# Patient Record
Sex: Female | Born: 1945
Health system: Southern US, Community
[De-identification: ages and names within clinical notes are randomized; demographics above are authoritative.]

## PROBLEM LIST (undated history)

## (undated) DIAGNOSIS — L719 Rosacea, unspecified: Secondary | ICD-10-CM

## (undated) DIAGNOSIS — Z8619 Personal history of other infectious and parasitic diseases: Secondary | ICD-10-CM

## (undated) DIAGNOSIS — E785 Hyperlipidemia, unspecified: Secondary | ICD-10-CM

## (undated) DIAGNOSIS — L570 Actinic keratosis: Secondary | ICD-10-CM

## (undated) DIAGNOSIS — J301 Allergic rhinitis due to pollen: Secondary | ICD-10-CM

## (undated) HISTORY — DX: Rosacea, unspecified: L71.9

## (undated) HISTORY — PX: ABDOMINAL HYSTERECTOMY: SHX81

## (undated) HISTORY — DX: Allergic rhinitis due to pollen: J30.1

## (undated) HISTORY — DX: Hyperlipidemia, unspecified: E78.5

## (undated) HISTORY — DX: Actinic keratosis: L57.0

## (undated) HISTORY — DX: Personal history of other infectious and parasitic diseases: Z86.19

## (undated) HISTORY — PX: OOPHORECTOMY: SHX86

---

## 1974-07-26 HISTORY — PX: TUBAL LIGATION: SHX77

## 2007-05-27 LAB — CONVERTED CEMR LAB: Pap Smear: NORMAL

## 2007-08-23 ENCOUNTER — Ambulatory Visit: Payer: Self-pay | Admitting: Nurse Practitioner

## 2007-08-23 DIAGNOSIS — E7800 Pure hypercholesterolemia, unspecified: Secondary | ICD-10-CM | POA: Insufficient documentation

## 2007-08-23 DIAGNOSIS — L259 Unspecified contact dermatitis, unspecified cause: Secondary | ICD-10-CM | POA: Insufficient documentation

## 2007-08-23 LAB — CONVERTED CEMR LAB: HDL: 62 mg/dL

## 2007-09-06 ENCOUNTER — Ambulatory Visit: Payer: Self-pay | Admitting: Gastroenterology

## 2007-09-11 ENCOUNTER — Ambulatory Visit: Payer: Self-pay | Admitting: *Deleted

## 2007-09-20 ENCOUNTER — Encounter (INDEPENDENT_AMBULATORY_CARE_PROVIDER_SITE_OTHER): Payer: Self-pay | Admitting: Nurse Practitioner

## 2007-09-20 ENCOUNTER — Ambulatory Visit: Payer: Self-pay | Admitting: Gastroenterology

## 2007-09-20 HISTORY — PX: COLONOSCOPY: SHX174

## 2007-10-04 ENCOUNTER — Ambulatory Visit: Payer: Self-pay | Admitting: Nurse Practitioner

## 2007-10-05 LAB — CONVERTED CEMR LAB
Albumin: 4.6 g/dL (ref 3.5–5.2)
Alkaline Phosphatase: 85 units/L (ref 39–117)
BUN: 14 mg/dL (ref 6–23)
CO2: 28 meq/L (ref 19–32)
Calcium: 9.7 mg/dL (ref 8.4–10.5)
Chloride: 107 meq/L (ref 96–112)
Cholesterol: 209 mg/dL — ABNORMAL HIGH (ref 0–200)
Eosinophils Absolute: 0.2 10*3/uL (ref 0.0–0.7)
Glucose, Bld: 88 mg/dL (ref 70–99)
HDL: 54 mg/dL (ref >39)
Lymphocytes Relative: 45 % (ref 12–46)
Lymphs Abs: 2.8 10*3/uL (ref 0.7–4.0)
MCV: 97.9 fL (ref 78.0–100.0)
Monocytes Relative: 9 % (ref 3–12)
Neutrophils Relative %: 42 % — ABNORMAL LOW (ref 43–77)
Potassium: 5 meq/L (ref 3.5–5.3)
RBC: 4.25 M/uL (ref 3.87–5.11)
Total Protein: 7.1 g/dL (ref 6.0–8.3)
Triglycerides: 187 mg/dL — ABNORMAL HIGH (ref <150)
WBC: 6.2 10*3/uL (ref 4.0–10.5)

## 2010-03-26 ENCOUNTER — Ambulatory Visit: Payer: Self-pay | Admitting: Family Medicine

## 2010-03-26 DIAGNOSIS — J309 Allergic rhinitis, unspecified: Secondary | ICD-10-CM | POA: Insufficient documentation

## 2010-03-26 DIAGNOSIS — N812 Incomplete uterovaginal prolapse: Secondary | ICD-10-CM | POA: Insufficient documentation

## 2010-04-29 ENCOUNTER — Ambulatory Visit: Payer: Self-pay

## 2010-04-29 ENCOUNTER — Encounter: Payer: Self-pay | Admitting: Family Medicine

## 2010-05-07 ENCOUNTER — Ambulatory Visit: Payer: Self-pay | Admitting: Family Medicine

## 2010-05-27 ENCOUNTER — Encounter: Payer: Self-pay | Admitting: Family Medicine

## 2010-06-11 ENCOUNTER — Ambulatory Visit: Payer: Self-pay | Admitting: Obstetrics & Gynecology

## 2010-08-27 NOTE — Letter (Signed)
Summary: Results Follow up Letter  [place] [facility]  [address] [place], [place] [address]   Phone: [phone]  Fax: [phone]    05/27/2010 MRN: [id number]    KEYANNI WHITTINGHILL [address] [place], [place]  [address]    Dear Ms. Metts,  The following are the results of your recent test(s):  Test         Result    Pap Smear:        Normal _____  Not Normal _____ Comments: ______________________________________________________ Cholesterol: LDL(Bad cholesterol):         Your goal is less than:         HDL (Good cholesterol):       Your goal is more than: Comments:  ______________________________________________________ Mammogram:        Normal _X____  Not Normal _____ Comments:Repeat in one year.   ___________________________________________________________________ Hemoccult:        Normal _____  Not normal _______ Comments:    _____________________________________________________________________ Other Tests:    We routinely do not discuss normal results over the telephone.  If you desire a copy of the results, or you have any questions about this information we can discuss them at your next office visit.   Sincerely,    Idamae Schuller Serigne Kubicek,MD  MT/ri

## 2010-08-27 NOTE — Letter (Signed)
Summary: Patient Questionnaire  Patient Questionnaire   Imported By: Beau Fanny 03/26/2010 14:00:49  _____________________________________________________________________  External Attachment:    Type:   Image     Comment:   External Document

## 2010-08-27 NOTE — Assessment & Plan Note (Signed)
Summary: NEW PT TO [facility]   Vital Signs:  Patient profile:   65 year old female Height:      63.75 inches Weight:      140.25 pounds BMI:     24.35 Temp:     98.8 degrees F oral Pulse rate:   80 / minute Pulse rhythm:   regular BP sitting:   136 / 84  (left arm) Cuff size:   regular  Vitals Entered By: Lewanda Rife LPN (March 26, 2010 11:19 AM) CC: New pt to establish   History of Present Illness: has not had regular physician in a while  [facility]   mother panc ca father lung ca  sister breast cancer   due to strong fam hx - did have a colonoscopy  2/09- is normal  mam was then too   has another year for medicare to kick in  does not qualify for medicaid   she does not work  hard time finding work -- keeps looking   never smoked  occ drinks alcohol -- wine , more beer this summer never had a drinking problem   hx of high cholesterol -- was 270s when last checked  stays very active  changed her diet over a year ago -- stays away from fatty foods  oatmeal/ bran for bkfast/ lots of veg and beans/ not much meat  on pravachol in the past - none for 2 years   bladder or uterine issues? -- has prolapse and this causes discomfort unable to have intercourse  got worse this summer - and one episode of bleeding -- very slight  no gyn exam in 2 years  occ has urgency to urinate without incontinence      Preventive Screening-Counseling & Management  Alcohol-Tobacco     Smoking Status: never  Caffeine-Diet-Exercise     Does Patient Exercise: no  Allergies (verified): No Known Drug Allergies  Past History:  Past Surgical History: Last updated: 08/23/2007 Tubal ligation 1976  Family History: Last updated: 03/26/2010 Family History Breast cancer 1st degree relative <50 - sister pancreatic cancer - mother deceased lung cancer - father (deceased) Family History Diabetes 1st degree relative - mother Maternal grandfather stroke,  deceased  Social History: Last updated: 03/26/2010 never smoked  Alcohol use-yes (socially) Drug use-no 4 children Married- 48 years  husband on disability after a car accident  Retired Regular exercise-no Never Smoked  Risk Factors: Caffeine Use: 3 (08/23/2007) Exercise: no (03/26/2010)  Risk Factors: Smoking Status: never (03/26/2010)  Past Medical History: Hx of chicken pox Hay fever High cholesterol  Family History: Family History Breast cancer 1st degree relative <50 - sister pancreatic cancer - mother deceased lung cancer - father (deceased) Family History Diabetes 1st degree relative - mother Maternal grandfather stroke, deceased  Social History: never smoked  Alcohol use-yes (socially) Drug use-no 4 children Married- 48 years  husband on disability after a car accident  Retired Regular exercise-no Never Smoked Does Patient Exercise:  no Smoking Status:  never  Review of Systems General:  Denies fatigue, loss of appetite, and malaise. Eyes:  Denies blurring and eye irritation. CV:  Denies chest pain or discomfort and palpitations. Resp:  Denies cough, shortness of breath, and wheezing. GI:  Denies abdominal pain, change in bowel habits, indigestion, and nausea. GU:  Complains of incontinence; denies dysuria, urinary frequency, and urinary hesitancy; occ/ seldom incontinence. MS:  Denies muscle aches, cramps, and muscle weakness. Derm:  Denies itching,  lesion(s), poor wound healing, and rash. Neuro:  Denies numbness and tingling. Heme:  Denies abnormal bruising and bleeding.  Physical Exam  General:  Well-developed,well-nourished,in no acute distress; alert,appropriate and cooperative throughout examination Head:  normocephalic, atraumatic, and no abnormalities observed.   Eyes:  vision grossly intact, pupils equal, pupils round, and pupils reactive to light.   Mouth:  pharynx pink and moist.   Neck:  supple with full rom and no masses or  thyromegally, no JVD or carotid bruit  Chest Wall:  No deformities, masses, or tenderness noted. Lungs:  Normal respiratory effort, chest expands symmetrically. Lungs are clear to auscultation, no crackles or wheezes. Heart:  Normal rate and regular rhythm. S1 and S2 normal without gallop, murmur, click, rub or other extra sounds. Abdomen:  Bowel sounds positive,abdomen soft and non-tender without masses, organomegaly or hernias noted. no renal bruits no suprapubic tenderness or fullness felt  Genitalia:  notable cystocele and mild rectocele mild uterine prolapse no M or tenderness on bimanual exam normal introitus, no external lesions, no vaginal discharge, and mucosa pink and moist.   Msk:  No deformity or scoliosis noted of thoracic or lumbar spine.  no acute joint changes  Pulses:  R and L carotid,radial,femoral,dorsalis pedis and posterior tibial pulses are full and equal bilaterally Extremities:  No clubbing, cyanosis, edema, or deformity noted with normal full range of motion of all joints.   Neurologic:  sensation intact to light touch, gait normal, and DTRs symmetrical and normal.   Skin:  Intact without suspicious lesions or rashes Cervical Nodes:  No lymphadenopathy noted Inguinal Nodes:  No significant adenopathy Psych:  normal affect, talkative and pleasant    Impression & Recommendations:  Problem # 1:  CYSTOCELE WITH INCOMPLETE UTERINE PROLAPSE (ICD-618.2) Assessment New will ref to gyn for further eval  ? if pessary would be an opiton Orders: Gynecologic Referral (Gyn)  Problem # 2:  OTHER SCREENING MAMMOGRAM (ICD-V76.12) Assessment: Comment Only schedule mammogram  pt does have family hx  Orders: Radiology Referral (Radiology)  Problem # 3:  HYPERCHOLESTEROLEMIA (ICD-272.0) Assessment: Unchanged rev low sat fat diet  pt cannot afford meds or testing at this time The following medications were removed from the medication list:    Pravachol 40 Mg Tabs  (Pravastatin sodium) .Marland Kitchen... 1 tablet by mouth nightly for cholesterol  Problem # 4:  Preventive Health Care (ICD-V70.0) Assessment: Comment Only pt may be interested in addressing more of these issues when she gets insurance  Complete Medication List: 1)  Hydrocortisone 2.5 % Ext Crea (Hydrocortisone) .Marland Kitchen.. 1 application to affected area two times a day as needed 2)  Magnesium ?mg  .... Take 1 tablet by mouth once a day 3)  Flax Seed Oil 1000 Mg Caps (Flaxseed (linseed)) .... Two capsules by mouth daily 4)  Tylenol Arthritis Pain 650 Mg Cr-tabs (Acetaminophen) .... Otc as directed.  Patient Instructions: 1)  we will do referral to gyn at check out  2)  we will do referral for mammogram at check out  3)  keep up the healthy diet   Current Allergies (reviewed today): No known allergies

## 2010-09-11 ENCOUNTER — Ambulatory Visit: Payer: Self-pay | Admitting: Obstetrics & Gynecology

## 2010-09-18 ENCOUNTER — Ambulatory Visit: Payer: Self-pay | Admitting: Advanced Practice Midwife

## 2010-10-07 ENCOUNTER — Ambulatory Visit: Payer: Self-pay | Admitting: Obstetrics and Gynecology

## 2011-04-01 ENCOUNTER — Encounter: Payer: Self-pay | Admitting: Family Medicine

## 2011-04-02 ENCOUNTER — Ambulatory Visit (INDEPENDENT_AMBULATORY_CARE_PROVIDER_SITE_OTHER): Payer: Self-pay | Admitting: Family Medicine

## 2011-04-02 ENCOUNTER — Encounter: Payer: Self-pay | Admitting: Family Medicine

## 2011-04-02 DIAGNOSIS — R5383 Other fatigue: Secondary | ICD-10-CM

## 2011-04-02 DIAGNOSIS — M791 Myalgia, unspecified site: Secondary | ICD-10-CM

## 2011-04-02 DIAGNOSIS — R51 Headache: Secondary | ICD-10-CM

## 2011-04-02 DIAGNOSIS — R519 Headache, unspecified: Secondary | ICD-10-CM | POA: Insufficient documentation

## 2011-04-02 DIAGNOSIS — IMO0001 Reserved for inherently not codable concepts without codable children: Secondary | ICD-10-CM

## 2011-04-02 DIAGNOSIS — R5381 Other malaise: Secondary | ICD-10-CM

## 2011-04-02 LAB — VITAMIN B12: Vitamin B-12: 516 pg/mL (ref 211–911)

## 2011-04-02 LAB — COMPREHENSIVE METABOLIC PANEL
ALT: 28 U/L (ref 0–35)
AST: 28 U/L (ref 0–37)
Albumin: 4 g/dL (ref 3.5–5.2)
CO2: 25 mEq/L (ref 19–32)
Calcium: 9 mg/dL (ref 8.4–10.5)
Chloride: 98 mEq/L (ref 96–112)
Creatinine, Ser: 0.8 mg/dL (ref 0.4–1.2)
GFR: 75.48 mL/min (ref >60.00)
Potassium: 4.8 mEq/L (ref 3.5–5.1)
Total Protein: 7.5 g/dL (ref 6.0–8.3)

## 2011-04-02 LAB — CBC WITH DIFFERENTIAL/PLATELET
Basophils Absolute: 0 10*3/uL (ref 0.0–0.1)
Eosinophils Absolute: 0 10*3/uL (ref 0.0–0.7)
Lymphocytes Relative: 48.7 % — ABNORMAL HIGH (ref 12.0–46.0)
MCHC: 33.8 g/dL (ref 30.0–36.0)
MCV: 94.1 fl (ref 78.0–100.0)
Monocytes Absolute: 0.7 10*3/uL (ref 0.1–1.0)
Neutrophils Relative %: 40.1 % — ABNORMAL LOW (ref 43.0–77.0)
Platelets: 199 10*3/uL (ref 150.0–400.0)
RBC: 4.37 Mil/uL (ref 3.87–5.11)
RDW: 12.4 % (ref 11.5–14.6)

## 2011-04-02 LAB — HIGH SENSITIVITY CRP: CRP, High Sensitivity: 25.68 mg/L — ABNORMAL HIGH (ref 0.000–5.000)

## 2011-04-02 NOTE — Progress Notes (Signed)
Subjective:    Patient ID: Chloe Ali, female    DOB: [birth year], 65 y.o.   MRN: [id number]  HPI CC: feeling ill  4 wk h/o bad headaches, progressing to muscle aches worse behind knees, and body aches, neck pain.  Legs feel like going to give out when standing for long period of time.  Also noticing increase in sweating and fatigue.  Felt like flu initially.  HA usually controlled with tylenol and excedrin.  Occasional indigestion.  HA described as frontal and spreads to back of head.  Not localized to temple region.  No vision changes.  Seemed to get better, then headaches and joint pain restarted 2 wks ago.    No h/o migraines, HA in past.  3 mo ago gave blood and told blood count normal.  No fevers/chills, recent tick bite or exposure, rashes.  No abd pain, n/v/d.  No vision changes, no hip or thigh problems.  Mild morning stiffness.  No sick contacts.  No recent travel outside of city.  Nonsmoker, social EtOH.  Medications and allergies reviewed and updated in chart.  Past histories reviewed and updated if relevant as below. Patient Active Problem List  Diagnoses  . HYPERCHOLESTEROLEMIA  . ALLERGIC RHINITIS CAUSE UNSPECIFIED  . CYSTOCELE WITH INCOMPLETE UTERINE PROLAPSE  . CONTACT DERMATITIS  . Headache  . Fatigue   Past Medical History  Diagnosis Date  . History of chicken pox   . Hay fever   . HLD (hyperlipidemia)    Past Surgical History  Procedure Date  . Tubal ligation 1976   History  Substance Use Topics  . Smoking status: Never Smoker   . Smokeless tobacco: Not on file  . Alcohol Use: Yes     Social   Family History  Problem Relation Age of Onset  . Breast cancer Sister   . Pancreatic cancer Mother   . Lung cancer Father   . Diabetes Mother   . Stroke Maternal Grandfather    No Known Allergies Current Outpatient Prescriptions on File Prior to Visit  Medication Sig Dispense Refill  . acetaminophen (TYLENOL) 650 MG CR tablet Take 650 mg by mouth  every 8 (eight) hours as needed.        . Flaxseed, Linseed, (FLAX SEED OIL) 1000 MG CAPS Take 2 capsules by mouth daily.         Review of Systems Per HPI    Objective:   Physical Exam  Nursing note and vitals reviewed. Constitutional: She appears well-developed and well-nourished. No distress.  HENT:  Head: Normocephalic and atraumatic.  Right Ear: Hearing, tympanic membrane, external ear and ear canal normal.  Left Ear: Hearing, tympanic membrane, external ear and ear canal normal.  Nose: Nose normal. No mucosal edema or rhinorrhea. Right sinus exhibits no maxillary sinus tenderness and no frontal sinus tenderness. Left sinus exhibits no maxillary sinus tenderness and no frontal sinus tenderness.  Mouth/Throat: Uvula is midline, oropharynx is clear and moist and mucous membranes are normal. No oropharyngeal exudate, posterior oropharyngeal edema, posterior oropharyngeal erythema or tonsillar abscesses.       No temporal bruits.  Mild tenderness to palpation temporal region.  Eyes: Pupils are equal, round, and reactive to light. No scleral icterus.  Neck: Normal range of motion and full passive range of motion without pain. Neck supple. Carotid bruit is not present.       No neck rigidity  Cardiovascular: Normal rate, regular rhythm, normal heart sounds and intact distal pulses.   No murmur  heard. Pulmonary/Chest: Effort normal and breath sounds normal. No respiratory distress. She has no wheezes. She has no rales.  Abdominal: Soft. Bowel sounds are normal. She exhibits no distension. There is no tenderness. There is no rebound and no guarding.  Musculoskeletal: Normal range of motion. She exhibits no edema.       Mild pain no stiffness at shoulders, no pain at hips. Mild tenderness posterior to knees  Lymphadenopathy:    She has no cervical adenopathy.  Skin: Skin is warm. No rash noted.       Mild papular rash upper chest, per pt longstanding photosensitivity  Psychiatric: She has  a normal mood and affect.          Assessment & Plan:

## 2011-04-02 NOTE — Patient Instructions (Signed)
Blood work today. Overall things are looking ok - we will call you with results. I'm not sure what's causing you to feel so bad, I don't think it's any infection going on.

## 2011-04-03 ENCOUNTER — Telehealth: Payer: Self-pay | Admitting: Family Medicine

## 2011-04-03 DIAGNOSIS — R51 Headache: Secondary | ICD-10-CM

## 2011-04-03 DIAGNOSIS — R5383 Other fatigue: Secondary | ICD-10-CM

## 2011-04-03 NOTE — Assessment & Plan Note (Addendum)
With myalgia/arthralgias and minimal morning stiffness.  ?rheum condition, check ESR to eval PMR. Unclear etiology of new headaches. Easily controlled with tylenol/excedrin. Monitor for now.  Nontoxic on exam today, no meningeal signs.  Has been going on for 4 wks.  Ok to work up outpatient.

## 2011-04-03 NOTE — Assessment & Plan Note (Signed)
No fevers.  No tick or mosquito exposure that pt knows of. Check blood work to further eval. ?ILI vs viral.

## 2011-04-03 NOTE — Telephone Encounter (Signed)
please notify blood work returned showing low sodium, recommend push fluid like gatorade, not water for now. vitamin B12, kidneys and liver normal, thyroid normal, blood count normal. One measure of inflammation in body returned normal, another returned elevated. May just be viral infection, if that is the case should feel better over time.   If not, I would like her to return this week for more blood work (placed in chart). If any worsening, or new symptoms like fever or vision changes, needs to return here, if significant worsening, needs to seek urgent care.

## 2011-04-05 NOTE — Telephone Encounter (Signed)
Patient notified. She said she has been drinking quite a lot of water, so she will drink gatorade for the next few days and see how she feels. She will come back here if not feeling better or if has fever or vision changes over the next few days and she will go to [facility] or ER if she gets significantly worse.

## 2011-05-06 ENCOUNTER — Encounter (HOSPITAL_COMMUNITY): Payer: Self-pay | Admitting: Radiology

## 2011-05-06 ENCOUNTER — Emergency Department (HOSPITAL_COMMUNITY)
Admission: EM | Admit: 2011-05-06 | Discharge: 2011-05-06 | Disposition: A | Discharge status: Home / Self Care | Payer: Self-pay | Attending: Emergency Medicine | Admitting: Emergency Medicine

## 2011-05-06 ENCOUNTER — Emergency Department (HOSPITAL_COMMUNITY): Payer: Self-pay

## 2011-05-06 DIAGNOSIS — R413 Other amnesia: Secondary | ICD-10-CM | POA: Insufficient documentation

## 2011-05-06 DIAGNOSIS — H538 Other visual disturbances: Secondary | ICD-10-CM | POA: Insufficient documentation

## 2011-05-06 DIAGNOSIS — H53149 Visual discomfort, unspecified: Secondary | ICD-10-CM | POA: Insufficient documentation

## 2011-05-06 DIAGNOSIS — R42 Dizziness and giddiness: Secondary | ICD-10-CM | POA: Insufficient documentation

## 2011-05-06 DIAGNOSIS — F29 Unspecified psychosis not due to a substance or known physiological condition: Secondary | ICD-10-CM | POA: Insufficient documentation

## 2011-05-06 DIAGNOSIS — R51 Headache: Secondary | ICD-10-CM | POA: Insufficient documentation

## 2011-05-06 LAB — URINALYSIS, ROUTINE W REFLEX MICROSCOPIC
Bilirubin Urine: NEGATIVE
Hgb urine dipstick: NEGATIVE
Ketones, ur: NEGATIVE mg/dL
Specific Gravity, Urine: 1.006 (ref 1.005–1.030)
Urobilinogen, UA: 0.2 mg/dL (ref 0.0–1.0)
pH: 6.5 (ref 5.0–8.0)

## 2011-05-06 LAB — COMPREHENSIVE METABOLIC PANEL WITH GFR
ALT: 9 U/L (ref 0–35)
AST: 12 U/L (ref 0–37)
Albumin: 3.3 g/dL — ABNORMAL LOW (ref 3.5–5.2)
Alkaline Phosphatase: 115 U/L (ref 39–117)
BUN: 9 mg/dL (ref 6–23)
CO2: 28 meq/L (ref 19–32)
Calcium: 9.8 mg/dL (ref 8.4–10.5)
Chloride: 99 meq/L (ref 96–112)
Creatinine, Ser: 0.69 mg/dL (ref 0.50–1.10)
GFR calc Af Amer: >90 mL/min
GFR calc non Af Amer: >90 mL/min
Glucose, Bld: 87 mg/dL (ref 70–99)
Potassium: 4.4 meq/L (ref 3.5–5.1)
Sodium: 136 meq/L (ref 135–145)
Total Bilirubin: 0.3 mg/dL (ref 0.3–1.2)
Total Protein: 7.5 g/dL (ref 6.0–8.3)

## 2011-05-06 LAB — DIFFERENTIAL
Basophils Absolute: 0 10*3/uL (ref 0.0–0.1)
Basophils Relative: 0 % (ref 0–1)
Lymphocytes Relative: 28 % (ref 12–46)
Neutro Abs: 6.9 10*3/uL (ref 1.7–7.7)
Neutrophils Relative %: 63 % (ref 43–77)

## 2011-05-06 LAB — CBC
HCT: 35 % — ABNORMAL LOW (ref 36.0–46.0)
Hemoglobin: 12.2 g/dL (ref 12.0–15.0)
RBC: 3.87 MIL/uL (ref 3.87–5.11)
WBC: 10.9 10*3/uL — ABNORMAL HIGH (ref 4.0–10.5)

## 2011-05-06 LAB — TROPONIN I: Troponin I: <0.3 ng/mL

## 2011-05-06 LAB — CK TOTAL AND CKMB (NOT AT ARMC)
CK, MB: 1.6 ng/mL (ref 0.3–4.0)
Relative Index: INVALID (ref 0.0–2.5)
Total CK: 57 U/L (ref 7–177)

## 2011-05-06 LAB — URINE MICROSCOPIC-ADD ON

## 2011-07-29 ENCOUNTER — Ambulatory Visit (INDEPENDENT_AMBULATORY_CARE_PROVIDER_SITE_OTHER): Payer: Self-pay | Admitting: Family Medicine

## 2011-07-29 DIAGNOSIS — Z23 Encounter for immunization: Secondary | ICD-10-CM

## 2011-07-29 NOTE — Progress Notes (Signed)
Flu shot today 

## 2011-12-20 ENCOUNTER — Telehealth: Payer: Self-pay | Admitting: Family Medicine

## 2011-12-20 DIAGNOSIS — E7800 Pure hypercholesterolemia, unspecified: Secondary | ICD-10-CM

## 2011-12-20 DIAGNOSIS — R5383 Other fatigue: Secondary | ICD-10-CM

## 2011-12-20 NOTE — Telephone Encounter (Signed)
Message copied by Judy Pimple on Mon Dec 20, 2011  9:51 PM ------      Message from: Alvina Chou      Created: Wed Dec 15, 2011 10:54 AM      Regarding: labs for Tues 5-28       Patient is scheduled for CPX labs, please order future labs, Thanks , Camelia Eng                  Do you want the other orders that Dr Reece Agar ordered several months ago?

## 2011-12-21 ENCOUNTER — Other Ambulatory Visit (INDEPENDENT_AMBULATORY_CARE_PROVIDER_SITE_OTHER): Payer: Medicare Other

## 2011-12-21 DIAGNOSIS — E7800 Pure hypercholesterolemia, unspecified: Secondary | ICD-10-CM

## 2011-12-21 DIAGNOSIS — R5383 Other fatigue: Secondary | ICD-10-CM

## 2011-12-21 DIAGNOSIS — R5381 Other malaise: Secondary | ICD-10-CM

## 2011-12-21 LAB — COMPREHENSIVE METABOLIC PANEL
ALT: 15 U/L (ref 0–35)
AST: 18 U/L (ref 0–37)
Alkaline Phosphatase: 83 U/L (ref 39–117)
Creatinine, Ser: 0.8 mg/dL (ref 0.4–1.2)
Total Bilirubin: 0.7 mg/dL (ref 0.3–1.2)

## 2011-12-21 LAB — LIPID PANEL
HDL: 59.1 mg/dL (ref >39.00)
Triglycerides: 168 mg/dL — ABNORMAL HIGH (ref 0.0–149.0)
VLDL: 33.6 mg/dL (ref 0.0–40.0)

## 2011-12-21 LAB — CBC WITH DIFFERENTIAL/PLATELET
Basophils Absolute: 0 10*3/uL (ref 0.0–0.1)
Eosinophils Absolute: 0.2 10*3/uL (ref 0.0–0.7)
HCT: 42.4 % (ref 36.0–46.0)
Hemoglobin: 14.4 g/dL (ref 12.0–15.0)
Lymphocytes Relative: 41 % (ref 12.0–46.0)
Lymphs Abs: 2.7 10*3/uL (ref 0.7–4.0)
MCHC: 33.8 g/dL (ref 30.0–36.0)
MCV: 94.7 fl (ref 78.0–100.0)
Monocytes Absolute: 0.4 10*3/uL (ref 0.1–1.0)
Neutro Abs: 3.2 10*3/uL (ref 1.4–7.7)
RDW: 12.9 % (ref 11.5–14.6)

## 2011-12-21 LAB — TSH: TSH: 2.2 u[IU]/mL (ref 0.35–5.50)

## 2011-12-21 LAB — LDL CHOLESTEROL, DIRECT: Direct LDL: 140.7 mg/dL

## 2011-12-28 ENCOUNTER — Encounter: Payer: Self-pay | Admitting: Family Medicine

## 2011-12-28 ENCOUNTER — Ambulatory Visit (INDEPENDENT_AMBULATORY_CARE_PROVIDER_SITE_OTHER): Payer: Medicare Other | Admitting: Family Medicine

## 2011-12-28 VITALS — BP 118/82 | HR 56 | Temp 97.5°F | Ht 63.5 in | Wt 132.8 lb

## 2011-12-28 DIAGNOSIS — L309 Dermatitis, unspecified: Secondary | ICD-10-CM

## 2011-12-28 DIAGNOSIS — Z1231 Encounter for screening mammogram for malignant neoplasm of breast: Secondary | ICD-10-CM

## 2011-12-28 DIAGNOSIS — E7800 Pure hypercholesterolemia, unspecified: Secondary | ICD-10-CM

## 2011-12-28 DIAGNOSIS — L259 Unspecified contact dermatitis, unspecified cause: Secondary | ICD-10-CM

## 2011-12-28 DIAGNOSIS — Z23 Encounter for immunization: Secondary | ICD-10-CM

## 2011-12-28 DIAGNOSIS — L989 Disorder of the skin and subcutaneous tissue, unspecified: Secondary | ICD-10-CM

## 2011-12-28 DIAGNOSIS — J309 Allergic rhinitis, unspecified: Secondary | ICD-10-CM

## 2011-12-28 MED ORDER — MOMETASONE FUROATE 0.1 % EX CREA: TOPICAL_CREAM | Freq: Every day | CUTANEOUS | Status: AC

## 2011-12-28 NOTE — Progress Notes (Signed)
Subjective:    Patient ID: Chloe Ali, female    DOB: Sep 21, 1945, 66 y.o.   MRN: [id number]  HPI Here for check up of chronic medical conditions and to review health mt list   Is feeling good overall  Allergies are worse this year  She breaks out when she gets hot-especially on her upper arms  scaley rash - is seasonal  ? Eczema  Uses cortisone cream otc  Has tried aaveno soap with oatmeal  Uses liquid soaps  Moisturizer - jergens  Also lesion on nose- worried it may be a skin cancer - is dry but not itchy    Chemistry      Component Value Date/Time   NA 142 12/21/2011 0937   K 4.4 12/21/2011 0937   CL 106 12/21/2011 0937   CO2 28 12/21/2011 0937   BUN 12 12/21/2011 0937   CREATININE 0.8 12/21/2011 0937      Component Value Date/Time   CALCIUM 9.6 12/21/2011 0937   ALKPHOS 83 12/21/2011 0937   AST 18 12/21/2011 0937   ALT 15 12/21/2011 0937   BILITOT 0.7 12/21/2011 0937      Lab Results  Component Value Date   WBC 6.5 12/21/2011   HGB 14.4 12/21/2011   HCT 42.4 12/21/2011   MCV 94.7 12/21/2011   PLT 257.0 12/21/2011    Lab Results  Component Value Date   TSH 2.20 12/21/2011        Wt is down 5 lb with bmi of 23- has kept it down from last summer  Diet- eats healthy , eats whole grain cereals and oatmeal , occ eggs/ fruit and veg fresh  Exercise- stays very active , was walking / now is working in the yard   Lipids Lab Results  Component Value Date   CHOL 217* 12/21/2011   CHOL 209* 10/04/2007   CHOL 269 08/23/2007   Lab Results  Component Value Date   HDL 59.10 12/21/2011   HDL 54 10/04/2007   HDL 62 11/06/2438   Lab Results  Component Value Date   LDLCALC 118* 10/04/2007   Lab Results  Component Value Date   TRIG 168.0* 12/21/2011   TRIG 187* 10/04/2007   TRIG 187 08/23/2007   Lab Results  Component Value Date   CHOLHDL 4 12/21/2011   CHOLHDL 3.9 Ratio 10/04/2007   Lab Results  Component Value Date   LDLDIRECT 140.7 12/21/2011   LDLDIRECT 170 08/23/2007     eats eggs  Not a lot of fried foods  No red meat  occ sausage -not often   imms Td--not in past 10 years  zost- has not called insurance co about that - may be interested  Pneumovax  -never had   mammo 10/11 Needs to get that sched Self exam-no lumps or changes   Gyn care Pap nl in 2011  No hx of cervical abn No new partners    colonosc 2/09- thinks she has 10 year recal No bowel changes   Patient Active Problem List  Diagnoses  . HYPERCHOLESTEROLEMIA  . ALLERGIC RHINITIS CAUSE UNSPECIFIED  . CYSTOCELE WITH INCOMPLETE UTERINE PROLAPSE  . CONTACT DERMATITIS  . Headache  . Fatigue  . Eczema  . Other screening mammogram  . Skin lesion   Past Medical History  Diagnosis Date  . History of chicken pox   . Hay fever   . HLD (hyperlipidemia)    Past Surgical History  Procedure Date  . Tubal ligation 1976   History  Substance Use Topics  . Smoking status: Never Smoker   . Smokeless tobacco: Not on file  . Alcohol Use: Yes     Social   Family History  Problem Relation Age of Onset  . Breast cancer Sister   . Pancreatic cancer Mother   . Lung cancer Father   . Diabetes Mother   . Stroke Maternal Grandfather    No Known Allergies Current Outpatient Prescriptions on File Prior to Visit  Medication Sig Dispense Refill  . acetaminophen (TYLENOL) 650 MG CR tablet Take 650 mg by mouth every 8 (eight) hours as needed.        Marland Kitchen aspirin-acetaminophen-caffeine (EXCEDRIN MIGRAINE) 250-250-65 MG per tablet Take 1 tablet by mouth every 6 (six) hours as needed.        . Cholecalciferol (VITAMIN D) 2000 UNITS CAPS Take 1 capsule by mouth daily.        . Flaxseed, Linseed, (FLAX SEED OIL) 1000 MG CAPS Take 2 capsules by mouth daily.        . Linoleic Acid-Sunflower Oil [phone] MG CAPS Take 2 capsules by mouth 2 (two) times daily.        . Magnesium 250 MG TABS Take 1 tablet by mouth daily.              Review of Systems Review of Systems  Constitutional: Negative  for fever, appetite change, fatigue and unexpected weight change.  Eyes: Negative for pain and visual disturbance.  ENT pos for runny nose from allergies  Respiratory: Negative for cough and shortness of breath.   Cardiovascular: Negative for cp or palpitations    Gastrointestinal: Negative for nausea, diarrhea and constipation.  Genitourinary: Negative for urgency and frequency.  Skin: Negative for pallor and pos for rash on arms- dry and itchy    Neurological: Negative for weakness, light-headedness, numbness and headaches.  Hematological: Negative for adenopathy. Does not bruise/bleed easily.  Psychiatric/Behavioral: Negative for dysphoric mood. The patient is not nervous/anxious.         Objective:   Physical Exam  Constitutional: She appears well-developed and well-nourished. No distress.  HENT:  Head: Normocephalic and atraumatic.  Right Ear: External ear normal.  Left Ear: External ear normal.  Nose: Nose normal.  Mouth/Throat: Oropharynx is clear and moist.  Eyes: Conjunctivae and EOM are normal. Pupils are equal, round, and reactive to light. Right eye exhibits no discharge. Left eye exhibits no discharge.  Neck: Normal range of motion. Neck supple. No JVD present. Carotid bruit is not present. No thyromegaly present.  Cardiovascular: Normal rate, regular rhythm, normal heart sounds and intact distal pulses.  Exam reveals no gallop.   Pulmonary/Chest: Breath sounds normal. No respiratory distress. She has no wheezes.  Abdominal: Soft. Bowel sounds are normal. She exhibits no distension, no abdominal bruit and no mass. There is no tenderness.  Genitourinary: No breast swelling, tenderness, discharge or bleeding.       Breast exam: No mass, nodules, thickening, tenderness, bulging, retraction, inflamation, nipple discharge or skin changes noted.  No axillary or clavicular LA.  Chaperoned exam.    Musculoskeletal: Normal range of motion. She exhibits no edema and no tenderness.    Lymphadenopathy:    She has no cervical adenopathy.  Neurological: She is alert. She has normal reflexes. No cranial nerve deficit. She exhibits normal muscle tone. Coordination normal.  Skin: Skin is warm and dry. Rash noted. No erythema. No pallor.       Dry patches with scale on upper arms  consistent with eczema No vesicles or weeping or signs of infection  Dry scaley lesion on nose- slt raised   Psychiatric: She has a normal mood and affect.          Assessment & Plan:

## 2011-12-28 NOTE — Patient Instructions (Addendum)
Use skin products and soaps with no color or fragrance Try elocon cream for your eczema  We will refer you to derm and for mammogram at check out  Td and pneumovax today If you are interested in a shingles/zoster vaccine - call your insurance to check on coverage,( you should not get it within 1 month of other vaccines) , then call us for a prescription  for it to take to a pharmacy that gives the shot

## 2011-12-29 NOTE — Assessment & Plan Note (Signed)
Scheduled annual screening mammogram Nl breast exam today  Encouraged monthly self exams   

## 2011-12-29 NOTE — Assessment & Plan Note (Signed)
Cholesterol is up  Disc goals for lipids and reasons to control them Rev labs with pt Rev low sat fat diet in detail

## 2011-12-29 NOTE — Assessment & Plan Note (Addendum)
otc meds on and off control symptoms seasonally

## 2011-12-29 NOTE — Assessment & Plan Note (Signed)
On nose with dry/ scaly patch Ref to derm for eval - AK or basal cell possible

## 2011-12-29 NOTE — Assessment & Plan Note (Signed)
On upper/inner arms - suspect atopic dermatitis - disc use of products with no color or fragrance and avoidance of hot water Will try elocon cream and update if not imp

## 2012-01-24 ENCOUNTER — Ambulatory Visit: Payer: Self-pay | Admitting: Family Medicine

## 2012-01-25 ENCOUNTER — Encounter: Payer: Self-pay | Admitting: Family Medicine

## 2012-06-15 ENCOUNTER — Ambulatory Visit (INDEPENDENT_AMBULATORY_CARE_PROVIDER_SITE_OTHER): Payer: Medicare Other

## 2012-06-15 DIAGNOSIS — Z23 Encounter for immunization: Secondary | ICD-10-CM

## 2012-08-09 ENCOUNTER — Ambulatory Visit: Payer: Medicare Other | Admitting: Family Medicine

## 2012-09-04 ENCOUNTER — Encounter: Payer: Self-pay | Admitting: Family Medicine

## 2012-09-04 ENCOUNTER — Ambulatory Visit (INDEPENDENT_AMBULATORY_CARE_PROVIDER_SITE_OTHER): Payer: Medicare Other | Admitting: Family Medicine

## 2012-09-04 VITALS — BP 138/85 | HR 70 | Temp 98.2°F | Ht 63.5 in | Wt 138.0 lb

## 2012-09-04 DIAGNOSIS — K589 Irritable bowel syndrome without diarrhea: Secondary | ICD-10-CM

## 2012-09-04 DIAGNOSIS — R110 Nausea: Secondary | ICD-10-CM | POA: Insufficient documentation

## 2012-09-04 NOTE — Assessment & Plan Note (Signed)
occ with fatty foods  No other symptoms  If this continues or worsens will consider imaging of gallbladder  Nl exam today

## 2012-09-04 NOTE — Progress Notes (Signed)
Subjective:    Patient ID: Chloe Ali, female    DOB: 1946/01/05, 67 y.o.   MRN: [id number]  HPI Here for f/u of chronic health problems   Has been having bowel changes lately in the past 6 months  Feels urge to go - and then can only pass "little pebbbles" MOM helps  She tried to increase her fiber- added shredded wheat (quite a bit more and also oatmeal) Every day  This seems to help  She does bloat a lot more than she used to be - much more gassy also  Gets queasy if she eats bacon or eggs She wonders about a gallbladder problem ( no pain , just nausea)    (in general she avoids fatty and fried foods)  She did quit her ca and D   She is a good water drinker  Exercises - uses kettle ball in the house -not as much as usual   Hyperlipidemia  Lab Results  Component Value Date   CHOL 217* 12/21/2011   HDL 59.10 12/21/2011   LDLCALC 118* 10/04/2007   LDLDIRECT 140.7 12/21/2011   TRIG 168.0* 12/21/2011   CHOLHDL 4 12/21/2011    Wt is up 6 lb  Zoster status - is interested  Needs to check with insurance   Mammogram per pt was 7/13 Self exam -no lumps   Colonosc 2/09-- 10 year recall she thinks- it was normal  Mood--very good   fallsor fx -none at all   Patient Active Problem List  Diagnosis  . HYPERCHOLESTEROLEMIA  . ALLERGIC RHINITIS CAUSE UNSPECIFIED  . CYSTOCELE WITH INCOMPLETE UTERINE PROLAPSE  . Eczema  . Other screening mammogram  . Irritable bowel syndrome  . Nausea alone   Past Medical History  Diagnosis Date  . History of chicken pox   . Hay fever   . HLD (hyperlipidemia)    Past Surgical History  Procedure Laterality Date  . Tubal ligation  1976   History  Substance Use Topics  . Smoking status: Never Smoker   . Smokeless tobacco: Not on file  . Alcohol Use: Yes     Comment: Social   Family History  Problem Relation Age of Onset  . Breast cancer Sister   . Pancreatic cancer Mother   . Lung cancer Father   . Diabetes Mother   . Stroke  Maternal Grandfather    No Known Allergies Current Outpatient Prescriptions on File Prior to Visit  Medication Sig Dispense Refill  . acetaminophen (TYLENOL) 650 MG CR tablet Take 650 mg by mouth every 8 (eight) hours as needed.        . mometasone (ELOCON) 0.1 % cream Apply topically daily. To affected area as needed  45 g  0   No current facility-administered medications on file prior to visit.    Review of Systems Review of Systems  Constitutional: Negative for fever, appetite change, fatigue and unexpected weight change.  Eyes: Negative for pain and visual disturbance.  Respiratory: Negative for cough and shortness of breath.   Cardiovascular: Negative for cp or palpitations    Gastrointestinal: Negative for diarrhea/abd pain/dark stool/ blood in stool/ rectal pain or vomiting Genitourinary: Negative for urgency and frequency. neg for hematuria  Skin: Negative for pallor or rash   Neurological: Negative for weakness, light-headedness, numbness and headaches.  Hematological: Negative for adenopathy. Does not bruise/bleed easily.  Psychiatric/Behavioral: Negative for dysphoric mood. The patient is not nervous/anxious.         Objective:   Physical  Exam  Constitutional: She appears well-developed and well-nourished. No distress.  HENT:  Head: Normocephalic and atraumatic.  Mouth/Throat: Oropharynx is clear and moist.  Eyes: Conjunctivae and EOM are normal. Pupils are equal, round, and reactive to light. No scleral icterus.  Neck: Normal range of motion. Neck supple. No JVD present. No thyromegaly present.  Cardiovascular: Normal rate and regular rhythm.   Pulmonary/Chest: Effort normal and breath sounds normal. No respiratory distress. She has no wheezes.  Abdominal: Soft. Bowel sounds are normal. She exhibits no distension and no mass. There is no tenderness. There is no rebound and no guarding.  Unremarkable abd exam today  Lymphadenopathy:    She has no cervical adenopathy.   Neurological: She is alert.  Skin: Skin is warm and dry. No rash noted. No erythema. No pallor.  Psychiatric: She has a normal mood and affect.          Assessment & Plan:

## 2012-09-04 NOTE — Assessment & Plan Note (Signed)
Recent constipation much improved with inc fiber, however now gas and bloating Suspect IBS Disc lifstyle change for this-handout given  May try to inc more fruit and veg inst to exercise and keep up a good fluid intake  If not imp will ref to GI colonosc utd from 09

## 2012-09-04 NOTE — Patient Instructions (Addendum)
Drink lots of fluids I want you to have a high fiber diet but if some fibers do not agree with you - try different thinks  Instead of shredded wheat - think about more apples/ pears/ prunes  Continue vegtables and oatmeal  Also citrucal - fiber supplement is helpful  If not improved or if blood in stool- let me know  If nausea does not improve- we may need to look into abdominal ultrasound - so keep me posted  Keep exercising

## 2013-01-01 ENCOUNTER — Telehealth: Payer: Self-pay | Admitting: Family Medicine

## 2013-01-01 DIAGNOSIS — E7800 Pure hypercholesterolemia, unspecified: Secondary | ICD-10-CM

## 2013-01-01 DIAGNOSIS — Z0000 Encounter for general adult medical examination without abnormal findings: Secondary | ICD-10-CM

## 2013-01-01 NOTE — Telephone Encounter (Signed)
Message copied by Judy Pimple on Mon Jan 01, 2013  9:10 PM ------      Message from: Alvina Chou      Created: Mon Dec 25, 2012  4:01 PM      Regarding: Lab orders for Tuesday, 6.10.14       Patient is scheduled for CPX labs, please order future labs, Thanks , Terri       ------

## 2013-01-02 ENCOUNTER — Other Ambulatory Visit (INDEPENDENT_AMBULATORY_CARE_PROVIDER_SITE_OTHER): Payer: Medicare Other

## 2013-01-02 DIAGNOSIS — E7800 Pure hypercholesterolemia, unspecified: Secondary | ICD-10-CM

## 2013-01-02 DIAGNOSIS — Z0000 Encounter for general adult medical examination without abnormal findings: Secondary | ICD-10-CM

## 2013-01-02 LAB — COMPREHENSIVE METABOLIC PANEL
ALT: 15 U/L (ref 0–35)
AST: 19 U/L (ref 0–37)
CO2: 30 mEq/L (ref 19–32)
Chloride: 105 mEq/L (ref 96–112)
Creatinine, Ser: 0.8 mg/dL (ref 0.4–1.2)
GFR: 75.07 mL/min (ref >60.00)
Sodium: 140 mEq/L (ref 135–145)
Total Bilirubin: 0.7 mg/dL (ref 0.3–1.2)
Total Protein: 7.7 g/dL (ref 6.0–8.3)

## 2013-01-02 LAB — LIPID PANEL
HDL: 48.9 mg/dL (ref >39.00)
Total CHOL/HDL Ratio: 5
VLDL: 34.6 mg/dL (ref 0.0–40.0)

## 2013-01-02 LAB — CBC WITH DIFFERENTIAL/PLATELET
Basophils Absolute: 0 10*3/uL (ref 0.0–0.1)
Eosinophils Absolute: 0.2 10*3/uL (ref 0.0–0.7)
Lymphocytes Relative: 36.2 % (ref 12.0–46.0)
Lymphs Abs: 2.9 10*3/uL (ref 0.7–4.0)
MCHC: 33.6 g/dL (ref 30.0–36.0)
Monocytes Relative: 6.4 % (ref 3.0–12.0)
Platelets: 283 10*3/uL (ref 150.0–400.0)
RDW: 13.2 % (ref 11.5–14.6)

## 2013-01-02 LAB — LDL CHOLESTEROL, DIRECT: Direct LDL: 149.7 mg/dL

## 2013-01-02 LAB — TSH: TSH: 1.91 u[IU]/mL (ref 0.35–5.50)

## 2013-01-09 ENCOUNTER — Ambulatory Visit (INDEPENDENT_AMBULATORY_CARE_PROVIDER_SITE_OTHER): Payer: Medicare Other | Admitting: Family Medicine

## 2013-01-09 ENCOUNTER — Encounter: Payer: Self-pay | Admitting: Family Medicine

## 2013-01-09 VITALS — BP 116/72 | HR 60 | Temp 98.6°F | Ht 63.75 in | Wt 136.5 lb

## 2013-01-09 DIAGNOSIS — E7800 Pure hypercholesterolemia, unspecified: Secondary | ICD-10-CM

## 2013-01-09 DIAGNOSIS — Z0000 Encounter for general adult medical examination without abnormal findings: Secondary | ICD-10-CM

## 2013-01-09 NOTE — Assessment & Plan Note (Signed)
Reviewed health habits including diet and exercise and skin cancer prevention Also reviewed health mt list, fam hx and immunizations  See HPI Reminded to work on adv directive and check on coverage for zoster vaccine

## 2013-01-09 NOTE — Assessment & Plan Note (Signed)
Chol up again Disc goals for lipids and reasons to control them Rev labs with pt Rev low sat fat diet in detail Will watch diet and get back to exercise  Fasting lab and f/u 6 mo

## 2013-01-09 NOTE — Progress Notes (Signed)
Subjective:    Patient ID: Chloe Ali, female    DOB: [birth year], 67 y.o.   MRN: [id number]  HPI I have personally reviewed the Medicare Annual Wellness questionnaire and have noted 1. The patient's medical and social history 2. Their use of alcohol, tobacco or illicit drugs 3. Their current medications and supplements 4. The patient's functional ability including ADL's, fall risks, home safety risks and hearing or visual             impairment. 5. Diet and physical activities 6. Evidence for depression or mood disorders  The patients weight, height, BMI have been recorded in the chart and visual acuity is per eye clinic.  I have made referrals, counseling and provided education to the patient based review of the above and I have provided the pt with a written personalized care plan for preventive services.  See scanned forms.  Routine anticipatory guidance given to patient.  See health maintenance. Flu 11/13 vaccine  Shingles has not had vaccine - has considered it -will call ins  PNA 6/13 vaccine  Tetanus 6/13 vaccine  Colon 2/09 - with 10 year recall  Breast cancer screening 7/13 mammogram  Self exam-no lumps  Pap/ gyn neg in 10/11 - no gyn problems or new partners , no problems at all  Advance directive-does not have a living will ? Unsure (has paperwork to look over) - plans to work on that Cognitive function addressed- see scanned forms- and if abnormal then additional documentation follows. -no major memory concerns / overall doing very well (occ misplaces something)  PMH and SH reviewed  Meds, vitals, and allergies reviewed.   ROS: See HPI.  Otherwise negative.    Falls -none  No fractures   Mood- very good / no depression   Lab Results  Component Value Date   CHOL 236* 01/02/2013   CHOL 217* 12/21/2011   CHOL 209* 10/04/2007   Lab Results  Component Value Date   HDL 48.90 01/02/2013   HDL 40.98 12/21/2011   HDL 54 10/04/2007   Lab Results  Component Value  Date   LDLCALC 118* 10/04/2007   Lab Results  Component Value Date   TRIG 173.0* 01/02/2013   TRIG 168.0* 12/21/2011   TRIG 187* 10/04/2007   Lab Results  Component Value Date   CHOLHDL 5 01/02/2013   CHOLHDL 4 12/21/2011   CHOLHDL 3.9 Ratio 10/04/2007   Lab Results  Component Value Date   LDLDIRECT 149.7 01/02/2013   LDLDIRECT 140.7 12/21/2011   LDLDIRECT 170 08/23/2007    Not walking like she used to - used to be daily , now too busy with other things/ some yardwork  LDL went up - does eat some fried and fatty foods  Thinks she can change chol with diet   Patient Active Problem List   Diagnosis Date Noted  . Encounter for Medicare annual wellness exam 01/01/2013  . Irritable bowel syndrome 09/04/2012  . Eczema 12/28/2011  . Other screening mammogram 12/28/2011  . ALLERGIC RHINITIS CAUSE UNSPECIFIED 03/26/2010  . CYSTOCELE WITH INCOMPLETE UTERINE PROLAPSE 03/26/2010  . HYPERCHOLESTEROLEMIA 08/23/2007   Past Medical History  Diagnosis Date  . History of chicken pox   . Hay fever   . HLD (hyperlipidemia)    Past Surgical History  Procedure Laterality Date  . Tubal ligation  1976   History  Substance Use Topics  . Smoking status: Never Smoker   . Smokeless tobacco: Not on file  . Alcohol Use: Yes  Comment: Social   Family History  Problem Relation Age of Onset  . Breast cancer Sister   . Pancreatic cancer Mother   . Lung cancer Father   . Diabetes Mother   . Stroke Maternal Grandfather    No Known Allergies No current outpatient prescriptions on file prior to visit.   No current facility-administered medications on file prior to visit.    Review of Systems Review of Systems  Constitutional: Negative for fever, appetite change, fatigue and unexpected weight change.  Eyes: Negative for pain and visual disturbance.  Respiratory: Negative for cough and shortness of breath.   Cardiovascular: Negative for cp or palpitations    Gastrointestinal: Negative for  nausea, diarrhea and constipation.  Genitourinary: Negative for urgency and frequency.  Skin: Negative for pallor or rash   Neurological: Negative for weakness, light-headedness, numbness and headaches.  Hematological: Negative for adenopathy. Does not bruise/bleed easily.  Psychiatric/Behavioral: Negative for dysphoric mood. The patient is not nervous/anxious.         Objective:   Physical Exam  Constitutional: She appears well-developed and well-nourished. No distress.  HENT:  Head: Normocephalic and atraumatic.  Right Ear: External ear normal.  Left Ear: External ear normal.  Nose: Nose normal.  Mouth/Throat: Oropharynx is clear and moist.  Eyes: Conjunctivae and EOM are normal. Pupils are equal, round, and reactive to light. Right eye exhibits no discharge. Left eye exhibits no discharge. No scleral icterus.  Neck: Normal range of motion. Neck supple. No JVD present. Carotid bruit is not present. No thyromegaly present.  Cardiovascular: Normal rate, regular rhythm, normal heart sounds and intact distal pulses.  Exam reveals no gallop.   Pulmonary/Chest: Effort normal and breath sounds normal. No respiratory distress. She has no wheezes. She exhibits no tenderness.  Abdominal: Soft. Bowel sounds are normal. She exhibits no distension, no abdominal bruit and no mass. There is no tenderness.  Genitourinary: No breast swelling, tenderness, discharge or bleeding.  Breast exam: No mass, nodules, thickening, tenderness, bulging, retraction, inflamation, nipple discharge or skin changes noted.  No axillary or clavicular LA.  Chaperoned exam.    Musculoskeletal: She exhibits no edema and no tenderness.  Lymphadenopathy:    She has no cervical adenopathy.  Neurological: She is alert. She has normal reflexes. No cranial nerve deficit. She exhibits normal muscle tone. Coordination normal.  Skin: Skin is warm and dry. No rash noted. No erythema. No pallor.  Solar lentigos diffusely    Psychiatric: She has a normal mood and affect.          Assessment & Plan:

## 2013-01-09 NOTE — Patient Instructions (Addendum)
If you are interested in a shingles/zoster vaccine - call your insurance to check on coverage,( you should not get it within 1 month of other vaccines) , then call us for a prescription  for it to take to a pharmacy that gives the shot , or make a nurse visit to get it here depending on your coverage Think about getting a living will together  For cholesterol Avoid red meat/ fried foods/ egg yolks/ fatty breakfast meats/ butter, cheese and high fat dairy/ and shellfish   Schedule fasting lab for cholesterol and follow up in about 6 months Work on getting back to exercise    Try to get 1200-1500 mg of calcium per day with at least 1000 iu of vitamin D - for bone health

## 2013-04-04 ENCOUNTER — Ambulatory Visit: Payer: Self-pay | Admitting: Family Medicine

## 2013-04-04 ENCOUNTER — Encounter: Payer: Self-pay | Admitting: Family Medicine

## 2013-04-05 ENCOUNTER — Encounter: Payer: Self-pay | Admitting: *Deleted

## 2013-07-11 ENCOUNTER — Ambulatory Visit: Payer: Medicare Other | Admitting: Family Medicine

## 2013-07-11 ENCOUNTER — Other Ambulatory Visit: Payer: Medicare Other

## 2014-06-05 ENCOUNTER — Telehealth: Payer: Self-pay | Admitting: Family Medicine

## 2014-06-05 NOTE — Telephone Encounter (Signed)
Pt called: missed her mcr wellness last year and needs one before the end of the year this year. Would you be able to accommodate pt for mcr wellness before end of 2015?

## 2014-06-05 NOTE — Telephone Encounter (Signed)
Please put her in wherever you can - if you can avoid evening hours that would be great

## 2014-06-06 NOTE — Telephone Encounter (Signed)
Scheduled 11/30

## 2014-06-16 ENCOUNTER — Telehealth: Payer: Self-pay | Admitting: Family Medicine

## 2014-06-16 DIAGNOSIS — Z0000 Encounter for general adult medical examination without abnormal findings: Secondary | ICD-10-CM

## 2014-06-16 DIAGNOSIS — E7800 Pure hypercholesterolemia, unspecified: Secondary | ICD-10-CM

## 2014-06-16 NOTE — Telephone Encounter (Signed)
-----   Message from Ellamae Sia sent at 06/13/2014 12:52 PM EST ----- Regarding: Lab orders for 11.23.15 Patient is scheduled for CPX labs, please order future labs, Thanks , Karna Christmas

## 2014-06-17 ENCOUNTER — Other Ambulatory Visit (INDEPENDENT_AMBULATORY_CARE_PROVIDER_SITE_OTHER): Payer: Medicare Other

## 2014-06-17 DIAGNOSIS — E7800 Pure hypercholesterolemia, unspecified: Secondary | ICD-10-CM

## 2014-06-17 DIAGNOSIS — Z0000 Encounter for general adult medical examination without abnormal findings: Secondary | ICD-10-CM

## 2014-06-17 LAB — CBC WITH DIFFERENTIAL/PLATELET
BASOS PCT: 0.8 % (ref 0.0–3.0)
Basophils Absolute: 0.1 10*3/uL (ref 0.0–0.1)
EOS ABS: 0.2 10*3/uL (ref 0.0–0.7)
Eosinophils Relative: 2.9 % (ref 0.0–5.0)
HCT: 41 % (ref 36.0–46.0)
HEMOGLOBIN: 13.6 g/dL (ref 12.0–15.0)
Lymphocytes Relative: 43 % (ref 12.0–46.0)
Lymphs Abs: 3.1 10*3/uL (ref 0.7–4.0)
MCHC: 33 g/dL (ref 30.0–36.0)
MCV: 94.9 fl (ref 78.0–100.0)
MONO ABS: 0.5 10*3/uL (ref 0.1–1.0)
Monocytes Relative: 6.7 % (ref 3.0–12.0)
NEUTROS ABS: 3.4 10*3/uL (ref 1.4–7.7)
Neutrophils Relative %: 46.6 % (ref 43.0–77.0)
Platelets: 268 10*3/uL (ref 150.0–400.0)
RBC: 4.32 Mil/uL (ref 3.87–5.11)
RDW: 12.7 % (ref 11.5–15.5)
WBC: 7.3 10*3/uL (ref 4.0–10.5)

## 2014-06-17 LAB — LIPID PANEL
CHOL/HDL RATIO: 5
Cholesterol: 255 mg/dL — ABNORMAL HIGH (ref 0–200)
HDL: 54.6 mg/dL (ref >39.00)
LDL CALC: 165 mg/dL — AB (ref 0–99)
NonHDL: 200.4
TRIGLYCERIDES: 177 mg/dL — AB (ref 0.0–149.0)
VLDL: 35.4 mg/dL (ref 0.0–40.0)

## 2014-06-17 LAB — COMPREHENSIVE METABOLIC PANEL
ALK PHOS: 95 U/L (ref 39–117)
ALT: 17 U/L (ref 0–35)
AST: 20 U/L (ref 0–37)
Albumin: 4.2 g/dL (ref 3.5–5.2)
BUN: 15 mg/dL (ref 6–23)
CALCIUM: 9.1 mg/dL (ref 8.4–10.5)
CO2: 25 mEq/L (ref 19–32)
CREATININE: 0.8 mg/dL (ref 0.4–1.2)
Chloride: 106 mEq/L (ref 96–112)
GFR: 72.67 mL/min (ref >60.00)
Glucose, Bld: 89 mg/dL (ref 70–99)
Potassium: 4.4 mEq/L (ref 3.5–5.1)
Sodium: 141 mEq/L (ref 135–145)
Total Bilirubin: 0.2 mg/dL (ref 0.2–1.2)
Total Protein: 6.9 g/dL (ref 6.0–8.3)

## 2014-06-17 LAB — TSH: TSH: 2.13 u[IU]/mL (ref 0.35–4.50)

## 2014-06-24 ENCOUNTER — Ambulatory Visit (INDEPENDENT_AMBULATORY_CARE_PROVIDER_SITE_OTHER): Payer: Medicare Other | Admitting: Family Medicine

## 2014-06-24 ENCOUNTER — Encounter: Payer: Self-pay | Admitting: Family Medicine

## 2014-06-24 VITALS — BP 142/72 | HR 70 | Temp 97.8°F | Ht 63.5 in | Wt 138.5 lb

## 2014-06-24 DIAGNOSIS — Z23 Encounter for immunization: Secondary | ICD-10-CM

## 2014-06-24 DIAGNOSIS — E7800 Pure hypercholesterolemia, unspecified: Secondary | ICD-10-CM

## 2014-06-24 DIAGNOSIS — E2839 Other primary ovarian failure: Secondary | ICD-10-CM

## 2014-06-24 DIAGNOSIS — Z0000 Encounter for general adult medical examination without abnormal findings: Secondary | ICD-10-CM

## 2014-06-24 NOTE — Patient Instructions (Signed)
Stop at check out for referral for bone density test  Make your own mammogram appointment  If you are interested in a shingles/zoster vaccine - call your insurance to check on coverage,( you should not get it within 1 month of other vaccines) , then call us for a prescription  for it to take to a pharmacy that gives the shot , or make a nurse visit to get it here depending on your coverage  work on an advanced directive (living will)- look at the packet I gave you For cholesterol - Avoid red meat/ fried foods/ egg yolks/ fatty breakfast meats/ butter, cheese and high fat dairy/ and shellfish    Schedule fasting labs for cholesterol in about 6 months   Fat and Cholesterol Control Diet Fat and cholesterol levels in your blood and organs are influenced by your diet. High levels of fat and cholesterol may lead to diseases of the heart, small and large blood vessels, gallbladder, liver, and pancreas. CONTROLLING FAT AND CHOLESTEROL WITH DIET Although exercise and lifestyle factors are important, your diet is key. That is because certain foods are known to raise cholesterol and others to lower it. The goal is to balance foods for their effect on cholesterol and more importantly, to replace saturated and trans fat with other types of fat, such as monounsaturated fat, polyunsaturated fat, and omega-3 fatty acids. On average, a person should consume no more than 15 to 17 g of saturated fat daily. Saturated and trans fats are considered "bad" fats, and they will raise LDL cholesterol. Saturated fats are primarily found in animal products such as meats, butter, and cream. However, that does not mean you need to give up all your favorite foods. Today, there are good tasting, low-fat, low-cholesterol substitutes for most of the things you like to eat. Choose low-fat or nonfat alternatives. Choose round or loin cuts of red meat. These types of cuts are lowest in fat and cholesterol. Chicken (without the skin), fish,  veal, and ground [place] breast are great choices. Eliminate fatty meats, such as hot dogs and salami. Even shellfish have little or no saturated fat. Have a 3 oz (85 g) portion when you eat lean meat, poultry, or fish. Trans fats are also called "partially hydrogenated oils." They are oils that have been scientifically manipulated so that they are solid at room temperature resulting in a longer shelf life and improved taste and texture of foods in which they are added. Trans fats are found in stick margarine, some tub margarines, cookies, crackers, and baked goods.  When baking and cooking, oils are a great substitute for butter. The monounsaturated oils are especially beneficial since it is believed they lower LDL and raise HDL. The oils you should avoid entirely are saturated tropical oils, such as coconut and palm.  Remember to eat a lot from food groups that are naturally free of saturated and trans fat, including fish, fruit, vegetables, beans, grains (barley, rice, couscous, bulgur wheat), and pasta (without cream sauces).  IDENTIFYING FOODS THAT LOWER FAT AND CHOLESTEROL  Soluble fiber may lower your cholesterol. This type of fiber is found in fruits such as apples, vegetables such as broccoli, potatoes, and carrots, legumes such as beans, peas, and lentils, and grains such as barley. Foods fortified with plant sterols (phytosterol) may also lower cholesterol. You should eat at least 2 g per day of these foods for a cholesterol lowering effect.  Read package labels to identify low-saturated fats, trans fat free, and low-fat foods  at the supermarket. Select cheeses that have only 2 to 3 g saturated fat per ounce. Use a heart-healthy tub margarine that is free of trans fats or partially hydrogenated oil. When buying baked goods (cookies, crackers), avoid partially hydrogenated oils. Breads and muffins should be made from whole grains (whole-wheat or whole oat flour, instead of "flour" or "enriched  flour"). Buy non-creamy canned soups with reduced salt and no added fats.  FOOD PREPARATION TECHNIQUES  Never deep-fry. If you must fry, either stir-fry, which uses very little fat, or use non-stick cooking sprays. When possible, broil, bake, or roast meats, and steam vegetables. Instead of putting butter or margarine on vegetables, use lemon and herbs, applesauce, and cinnamon (for squash and sweet potatoes). Use nonfat yogurt, salsa, and low-fat dressings for salads.  LOW-SATURATED FAT / LOW-FAT FOOD SUBSTITUTES Meats / Saturated Fat (g)  Avoid: Steak, marbled (3 oz/85 g) / 11 g  Choose: Steak, lean (3 oz/85 g) / 4 g  Avoid: Hamburger (3 oz/85 g) / 7 g  Choose: Hamburger, lean (3 oz/85 g) / 5 g  Avoid: Ham (3 oz/85 g) / 6 g  Choose: Ham, lean cut (3 oz/85 g) / 2.4 g  Avoid: Chicken, with skin, dark meat (3 oz/85 g) / 4 g  Choose: Chicken, skin removed, dark meat (3 oz/85 g) / 2 g  Avoid: Chicken, with skin, light meat (3 oz/85 g) / 2.5 g  Choose: Chicken, skin removed, light meat (3 oz/85 g) / 1 g Dairy / Saturated Fat (g)  Avoid: Whole milk (1 cup) / 5 g  Choose: Low-fat milk, 2% (1 cup) / 3 g  Choose: Low-fat milk, 1% (1 cup) / 1.5 g  Choose: Skim milk (1 cup) / 0.3 g  Avoid: Hard cheese (1 oz/28 g) / 6 g  Choose: Skim milk cheese (1 oz/28 g) / 2 to 3 g  Avoid: Cottage cheese, 4% fat (1 cup) / 6.5 g  Choose: Low-fat cottage cheese, 1% fat (1 cup) / 1.5 g  Avoid: Ice cream (1 cup) / 9 g  Choose: Sherbet (1 cup) / 2.5 g  Choose: Nonfat frozen yogurt (1 cup) / 0.3 g  Choose: Frozen fruit bar / trace  Avoid: Whipped cream (1 tbs) / 3.5 g  Choose: Nondairy whipped topping (1 tbs) / 1 g Condiments / Saturated Fat (g)  Avoid: Mayonnaise (1 tbs) / 2 g  Choose: Low-fat mayonnaise (1 tbs) / 1 g  Avoid: Butter (1 tbs) / 7 g  Choose: Extra light margarine (1 tbs) / 1 g  Avoid: Coconut oil (1 tbs) / 11.8 g  Choose: Olive oil (1 tbs) / 1.8 g  Choose: Corn oil  (1 tbs) / 1.7 g  Choose: Safflower oil (1 tbs) / 1.2 g  Choose: Sunflower oil (1 tbs) / 1.4 g  Choose: Soybean oil (1 tbs) / 2.4 g  Choose: Canola oil (1 tbs) / 1 g Document Released: 07/12/2005 Document Revised: 11/06/2012 Document Reviewed: 10/10/2013 [employer] Patient Information 2015 [place], [place]. This information is not intended to replace advice given to you by your health care provider. Make sure you discuss any questions you have with your health care provider.

## 2014-06-24 NOTE — Progress Notes (Signed)
Subjective:    Patient ID: Chloe Ali, female    DOB: 1946/01/09, 68 y.o.   MRN: [id number]  HPI Here for annual medicare visit and chronic/ acute medical problems  I have personally reviewed the Medicare Annual Wellness questionnaire and have noted 1. The patient's medical and social history 2. Their use of alcohol, tobacco or illicit drugs 3. Their current medications and supplements 4. The patient's functional ability including ADL's, fall risks, home safety risks and hearing or visual             impairment. 5. Diet and physical activities 6. Evidence for depression or mood disorders  The patients weight, height, BMI have been recorded in the chart and visual acuity is per eye clinic.  I have made referrals, counseling and provided education to the patient based review of the above and I have provided the pt with a written personalized care plan for preventive services.  Doing well overall Taking care of herself    See scanned forms.  Routine anticipatory guidance given to patient.  See health maintenance. Colon cancer screening colonoscopy 2/09 (aunt had colon cancer)-no polyps , occ she has IBS symptoms  Breast cancer screening - due for mammogram (was nl 9/14)  Self breast exam- no lumps or changes  Never had abn pap , is monogamous  Flu vaccine wants to get that today Tetanus vaccine 6/13 Pneumovax 6/13  Zoster vaccine has not had the vaccine - has to check on coverage   Advance directive - does not have one and plans to work on it - given packet Cognitive function addressed- see scanned forms- and if abnormal then additional documentation follows.   PMH and SH reviewed  Meds, vitals, and allergies reviewed.   ROS: See HPI.  Otherwise negative.    Hyperlipidemia    Lab Results  Component Value Date   CHOL 255* 06/17/2014   CHOL 236* 01/02/2013   CHOL 217* 12/21/2011   Lab Results  Component Value Date   HDL 54.60 06/17/2014   HDL 48.90 01/02/2013     HDL 59.10 12/21/2011   Lab Results  Component Value Date   LDLCALC 165* 06/17/2014   LDLCALC 118* 10/04/2007   Lab Results  Component Value Date   TRIG 177.0* 06/17/2014   TRIG 173.0* 01/02/2013   TRIG 168.0* 12/21/2011   Lab Results  Component Value Date   CHOLHDL 5 06/17/2014   CHOLHDL 5 01/02/2013   CHOLHDL 4 12/21/2011   Lab Results  Component Value Date   LDLDIRECT 149.7 01/02/2013   LDLDIRECT 140.7 12/21/2011   LDLDIRECT 170 08/23/2007   wants to avoid chol med  Will work on diet   Patient Active Problem List   Diagnosis Date Noted  . Encounter for Medicare annual wellness exam 01/01/2013  . Irritable bowel syndrome 09/04/2012  . Eczema 12/28/2011  . Other screening mammogram 12/28/2011  . ALLERGIC RHINITIS CAUSE UNSPECIFIED 03/26/2010  . CYSTOCELE WITH INCOMPLETE UTERINE PROLAPSE 03/26/2010  . HYPERCHOLESTEROLEMIA 08/23/2007   Past Medical History  Diagnosis Date  . History of chicken pox   . Hay fever   . HLD (hyperlipidemia)    Past Surgical History  Procedure Laterality Date  . Tubal ligation  1976   History  Substance Use Topics  . Smoking status: Never Smoker   . Smokeless tobacco: Not on file  . Alcohol Use: 0.0 oz/week    0 Not specified per week     Comment: Social   Family History  Problem Relation Age of Onset  . Breast cancer Sister   . Pancreatic cancer Mother   . Lung cancer Father   . Diabetes Mother   . Stroke Maternal Grandfather    No Known Allergies No current outpatient prescriptions on file prior to visit.   No current facility-administered medications on file prior to visit.    Review of Systems     Objective:   Physical Exam        Assessment & Plan:

## 2014-06-24 NOTE — Assessment & Plan Note (Signed)
Reviewed health habits including diet and exercise and skin cancer prevention Reviewed appropriate screening tests for age  Also reviewed health mt list, fam hx and immunization status , as well as social and family history   Pt will schedule own mammogram  Ref for dexa  Will work on advanced directive  prevnar vaccine today

## 2014-06-24 NOTE — Assessment & Plan Note (Signed)
Disc goals for lipids and reasons to control them Rev labs with pt Rev low sat fat diet in detail This is up significantly  Will work on diet - and re check in 6 mo  If not imp consider statin

## 2014-06-24 NOTE — Assessment & Plan Note (Signed)
Ref for first screening dexa No hx of fx or falls Disc need for calcium/ vitamin D/ wt bearing exercise and bone density test every 2 y to monitor Disc safety/ fracture risk in detail

## 2014-06-24 NOTE — Progress Notes (Signed)
Pre visit review using our clinic review tool, if applicable. No additional management support is needed unless otherwise documented below in the visit note. 

## 2014-08-13 ENCOUNTER — Ambulatory Visit: Payer: Self-pay | Admitting: Family Medicine

## 2014-08-14 ENCOUNTER — Encounter: Payer: Self-pay | Admitting: Family Medicine

## 2014-08-15 ENCOUNTER — Encounter: Payer: Self-pay | Admitting: *Deleted

## 2014-08-16 ENCOUNTER — Encounter: Payer: Self-pay | Admitting: Family Medicine

## 2014-08-16 DIAGNOSIS — M8580 Other specified disorders of bone density and structure, unspecified site: Secondary | ICD-10-CM | POA: Insufficient documentation

## 2014-12-17 ENCOUNTER — Ambulatory Visit (INDEPENDENT_AMBULATORY_CARE_PROVIDER_SITE_OTHER): Payer: Medicare Other | Admitting: Family Medicine

## 2014-12-17 ENCOUNTER — Encounter: Payer: Self-pay | Admitting: Family Medicine

## 2014-12-17 VITALS — BP 118/72 | HR 67 | Temp 98.2°F | Ht 63.75 in | Wt 133.5 lb

## 2014-12-17 DIAGNOSIS — N812 Incomplete uterovaginal prolapse: Secondary | ICD-10-CM

## 2014-12-17 NOTE — Patient Instructions (Signed)
Stop at check out for referral to urology  Keep doing kegel exercises

## 2014-12-17 NOTE — Assessment & Plan Note (Signed)
This creates discomfort and occ urge incontinence No success with pessary-which caused incontinence  Pt desires ref to urol to disc tx opt and surgery Ref made

## 2014-12-17 NOTE — Progress Notes (Signed)
Subjective:    Patient ID: Chloe Ali, female    DOB: 25-Nov-1945, 69 y.o.   MRN: [id number]  HPI Here to discuss cystocele tx options   Has a bulge - sticks out and creates pressure  Tried a pessary - lost continence of urine with that   Right now -not much of that - very seldom stress incont  occ has urge incontinence - not very often   Overall she can hold urine quite well  Drinks a lot of water and green tea  She also has to urinate 3 times per night   Has not seen a urologist yet   Pretty healthy  Does not smoke Wt is down 5 lb with bmi of 23   Patient Active Problem List   Diagnosis Date Noted  . Osteopenia 08/16/2014  . Estrogen deficiency 06/24/2014  . Encounter for Medicare annual wellness exam 01/01/2013  . Irritable bowel syndrome 09/04/2012  . Eczema 12/28/2011  . Other screening mammogram 12/28/2011  . ALLERGIC RHINITIS CAUSE UNSPECIFIED 03/26/2010  . CYSTOCELE WITH INCOMPLETE UTERINE PROLAPSE 03/26/2010  . HYPERCHOLESTEROLEMIA 08/23/2007   Past Medical History  Diagnosis Date  . History of chicken pox   . Hay fever   . HLD (hyperlipidemia)    Past Surgical History  Procedure Laterality Date  . Tubal ligation  1976   History  Substance Use Topics  . Smoking status: Never Smoker   . Smokeless tobacco: Not on file  . Alcohol Use: 0.0 oz/week    0 Standard drinks or equivalent per week     Comment: Social   Family History  Problem Relation Age of Onset  . Breast cancer Sister   . Pancreatic cancer Mother   . Lung cancer Father   . Diabetes Mother   . Stroke Maternal Grandfather    No Known Allergies No current outpatient prescriptions on file prior to visit.   No current facility-administered medications on file prior to visit.      Review of Systems Review of Systems  Constitutional: Negative for fever, appetite change, fatigue and unexpected weight change.  Eyes: Negative for pain and visual disturbance.  Respiratory: Negative  for cough and shortness of breath.   Cardiovascular: Negative for cp or palpitations    Gastrointestinal: Negative for nausea, diarrhea and constipation.  Genitourinary: Negative for urgency and frequency. pos for nocturia/ pos for occ urinary incontinence  Skin: Negative for pallor or rash   Neurological: Negative for weakness, light-headedness, numbness and headaches.  Hematological: Negative for adenopathy. Does not bruise/bleed easily.  Psychiatric/Behavioral: Negative for dysphoric mood. The patient is not nervous/anxious.         Objective:   Physical Exam  Constitutional: She appears well-developed and well-nourished. No distress.  Well appearing   HENT:  Head: Normocephalic and atraumatic.  Mouth/Throat: Oropharynx is clear and moist.  Eyes: Conjunctivae and EOM are normal. Pupils are equal, round, and reactive to light. No scleral icterus.  Neck: Normal range of motion. Neck supple.  Cardiovascular: Normal rate and regular rhythm.   Pulmonary/Chest: Effort normal and breath sounds normal.  Abdominal: Soft. Bowel sounds are normal. She exhibits no distension and no mass. There is no tenderness. There is no rebound and no guarding.  No suprapubic tenderness or fullness  No cva tenderness   Musculoskeletal: She exhibits no edema.  Lymphadenopathy:    She has no cervical adenopathy.  Neurological: She is alert.  Skin: Skin is warm and dry. No rash noted. No pallor.  Psychiatric: She has a normal mood and affect.          Assessment & Plan:   Problem List Items Addressed This Visit    CYSTOCELE WITH INCOMPLETE UTERINE PROLAPSE - Primary    This creates discomfort and occ urge incontinence No success with pessary-which caused incontinence  Pt desires ref to urol to disc tx opt and surgery Ref made        Relevant Orders   Ambulatory referral to Urology

## 2014-12-17 NOTE — Progress Notes (Signed)
Pre visit review using our clinic review tool, if applicable. No additional management support is needed unless otherwise documented below in the visit note. 

## 2015-04-16 ENCOUNTER — Institutional Professional Consult (permissible substitution): Payer: Medicare Other | Admitting: Family Medicine

## 2015-04-23 ENCOUNTER — Ambulatory Visit (INDEPENDENT_AMBULATORY_CARE_PROVIDER_SITE_OTHER): Payer: Medicare Other | Admitting: Family Medicine

## 2015-04-23 ENCOUNTER — Encounter: Payer: Self-pay | Admitting: Family Medicine

## 2015-04-23 DIAGNOSIS — IMO0002 Reserved for concepts with insufficient information to code with codable children: Secondary | ICD-10-CM | POA: Insufficient documentation

## 2015-04-23 DIAGNOSIS — N816 Rectocele: Secondary | ICD-10-CM | POA: Diagnosis not present

## 2015-04-23 DIAGNOSIS — N812 Incomplete uterovaginal prolapse: Secondary | ICD-10-CM

## 2015-04-23 NOTE — Assessment & Plan Note (Signed)
Repair at time of surgery.

## 2015-04-23 NOTE — Patient Instructions (Signed)
Hysterectomy Information  A hysterectomy is a surgery in which your uterus is removed. This surgery may be done to treat various medical problems. After the surgery, you will no longer have menstrual periods. The surgery will also make you unable to become pregnant (sterile). The fallopian tubes and ovaries can be removed (bilateral salpingo-oophorectomy) during this surgery as well.  REASONS FOR A HYSTERECTOMY  Persistent, abnormal bleeding.  Lasting (chronic) pelvic pain or infection.  The lining of the uterus (endometrium) starts growing outside the uterus (endometriosis).  The endometrium starts growing in the muscle of the uterus (adenomyosis).  The uterus falls down into the vagina (pelvic organ prolapse).  Noncancerous growths in the uterus (uterine fibroids) that cause symptoms.  Precancerous cells.  Cervical cancer or uterine cancer. TYPES OF HYSTERECTOMIES  Supracervical hysterectomy--In this type, the top part of the uterus is removed, but not the cervix.  Total hysterectomy--The uterus and cervix are removed.  Radical hysterectomy--The uterus, the cervix, and the fibrous tissue that holds the uterus in place in the pelvis (parametrium) are removed. WAYS A HYSTERECTOMY CAN BE PERFORMED  Abdominal hysterectomy--A large surgical cut (incision) is made in the abdomen. The uterus is removed through this incision.  Vaginal hysterectomy--An incision is made in the vagina. The uterus is removed through this incision. There are no abdominal incisions.  Conventional laparoscopic hysterectomy--Three or four small incisions are made in the abdomen. A thin, lighted tube with a camera (laparoscope) is inserted into one of the incisions. Other tools are put through the other incisions. The uterus is cut into small pieces. The small pieces are removed through the incisions, or they are removed through the vagina.  Laparoscopically assisted vaginal hysterectomy (LAVH)--Three or four  small incisions are made in the abdomen. Part of the surgery is performed laparoscopically and part vaginally. The uterus is removed through the vagina.  Robot-assisted laparoscopic hysterectomy--A laparoscope and other tools are inserted into 3 or 4 small incisions in the abdomen. A computer-controlled device is used to give the surgeon a 3D image and to help control the surgical instruments. This allows for more precise movements of surgical instruments. The uterus is cut into small pieces and removed through the incisions or removed through the vagina. RISKS AND COMPLICATIONS  Possible complications associated with this procedure include:  Bleeding and risk of blood transfusion. Tell your health care provider if you do not want to receive any blood products.  Blood clots in the legs or lung.  Infection.  Injury to surrounding organs.  Problems or side effects related to anesthesia.  Conversion to an abdominal hysterectomy from one of the other techniques. WHAT TO EXPECT AFTER A HYSTERECTOMY  You will be given pain medicine.  You will need to have someone with you for the first 3-5 days after you go home.  You will need to follow up with your surgeon in 2-4 weeks after surgery to evaluate your progress.  You may have early menopause symptoms such as hot flashes, night sweats, and insomnia.  If you had a hysterectomy for a problem that was not cancer or not a condition that could lead to cancer, then you no longer need Pap tests. However, even if you no longer need a Pap test, a regular exam is a good idea to make sure no other problems are starting. Document Released: 01/05/2001 Document Revised: 05/02/2013 Document Reviewed: 03/19/2013 [facility] Patient Information 2015 [place], [place]. This information is not intended to replace advice given to you by your health care  provider. Make sure you discuss any questions you have with your health care provider.

## 2015-04-23 NOTE — Assessment & Plan Note (Signed)
Pt desires definitive treatment.  Will proceed with booking patient for TVH/BSO concomitant with Urology. Risks include but are not limited to bleeding, infection, injury to surrounding structures, including bowel, bladder and ureters, blood clots, and death.  Likelihood of success is moderate.

## 2015-04-23 NOTE — Progress Notes (Signed)
   Subjective:    Patient ID: Chloe Ali is a 69 y.o. female presenting with Advice Only  on 04/23/2015  HPI: Patient is a [id number] who is here for surgical consult.  She has a h/o prolapse and was scheduled by Dr. Garwin Brothers for TVH/BSO and Dr. Matilde Sprang for repair, when she found out Dr. Garwin Brothers was not in her network.  She would like to get this surgery scheduled as soon as possible.  She has previously had a pessary, but continues to have a bulge and desires definitive repair. She brings records from both of these previous evaluations today which are reviewed in depth. Patient's PMH, PSH, FH, SH, Meds, and Allergies are all reviewed and updated in the chart.  Review of Systems  Constitutional: Negative for fever and chills.  HENT: Negative for congestion, nosebleeds and rhinorrhea.   Eyes: Negative for visual disturbance.  Respiratory: Negative for chest tightness and shortness of breath.   Cardiovascular: Negative for chest pain.  Gastrointestinal: Negative for nausea, vomiting, abdominal pain, diarrhea, constipation, blood in stool and abdominal distention.  Genitourinary: Negative for dysuria, frequency, vaginal bleeding and menstrual problem.  Musculoskeletal: Negative for arthralgias.  Skin: Negative for rash.  Neurological: Negative for dizziness and headaches.  Psychiatric/Behavioral: Negative for behavioral problems, sleep disturbance and dysphoric mood.  All other systems reviewed and are negative.     Objective:    BP 147/58 mmHg  Pulse 82  Ht 5\' 3"  (1.6 m)  Wt 133 lb (60.328 kg)  BMI 23.57 kg/m2 Physical Exam  Constitutional: She is oriented to person, place, and time. She appears well-developed and well-nourished. No distress.  HENT:  Head: Normocephalic and atraumatic.  Eyes: No scleral icterus.  Neck: Neck supple.  Cardiovascular: Normal rate.   Pulmonary/Chest: Effort normal.  Abdominal: Soft.  Neurological: She is alert and oriented to person, place, and  time.  Skin: Skin is warm and dry.  Psychiatric: She has a normal mood and affect.        Assessment & Plan:   Problem List Items Addressed This Visit      Unprioritized   CYSTOCELE WITH INCOMPLETE UTERINE PROLAPSE    Pt desires definitive treatment.  Will proceed with booking patient for TVH/BSO concomitant with Urology. Risks include but are not limited to bleeding, infection, injury to surrounding structures, including bowel, bladder and ureters, blood clots, and death.  Likelihood of success is moderate.       RESOLVED: Cystocele - Primary   Rectocele    Repair at time of surgery.         Total face-to-face time with patient: 20 minutes. Over 50% of encounter was spent on counseling and coordination of care.  Return in about 3 months (around 07/23/2015) for postop check.  PRATT,TANYA S 04/23/2015 3:26 PM

## 2015-05-06 ENCOUNTER — Encounter (HOSPITAL_COMMUNITY): Payer: Self-pay | Admitting: *Deleted

## 2015-05-06 ENCOUNTER — Other Ambulatory Visit: Payer: Self-pay | Admitting: Urology

## 2015-07-04 NOTE — H&P (Signed)
  Chloe Ali is an 69 y.o. [id number] female.   Chief Complaint: prolapse HPI: She has a h/o prolapse and was scheduled by Dr. Garwin Brothers for TVH/BSO and Dr. Matilde Sprang for repair, when she found out Dr. Garwin Brothers was not in her network. She would like to get this surgery scheduled as soon as possible. She has previously had a pessary, but continues to have a bulge and desires definitive repair.  Past Medical History  Diagnosis Date  . History of chicken pox   . Hay fever   . HLD (hyperlipidemia)     Past Surgical History  Procedure Laterality Date  . Tubal ligation  1976    Family History  Problem Relation Age of Onset  . Breast cancer Sister   . Cancer Sister 48    breast  . Pancreatic cancer Mother   . Diabetes Mother   . Cancer Mother     pancreatic  . Lung cancer Father   . Cancer Father     lung  . Stroke Maternal Grandfather    Social History:  reports that she has never smoked. She does not have any smokeless tobacco history on file. She reports that she drinks alcohol. She reports that she does not use illicit drugs.  Allergies: No Known Allergies  No current facility-administered medications on file prior to encounter.   Current Outpatient Prescriptions on File Prior to Encounter  Medication Sig Dispense Refill  . cholecalciferol (VITAMIN D) 1000 UNITS tablet Take 1,000 Units by mouth daily.    . Multiple Vitamins-Minerals (CENTRUM ADULTS PO) Take 1 tablet by mouth daily.      A comprehensive review of systems was negative.  There were no vitals taken for this visit. General appearance: alert, cooperative and appears stated age Head: Normocephalic, without obvious abnormality, atraumatic Neck: supple, symmetrical, trachea midline Lungs: normal effort Heart: regular rate and rhythm Abdomen: soft, non-tender; bowel sounds normal; no masses,  no organomegaly Extremities: extremities normal, atraumatic, no cyanosis or edema Skin: Skin color, texture, turgor  normal. No rashes or lesions Neurologic: Grossly normal   Lab Results  Component Value Date   WBC 7.3 06/17/2014   HGB 13.6 06/17/2014   HCT 41.0 06/17/2014   MCV 94.9 06/17/2014   PLT 268.0 06/17/2014   No results found for: PREGTESTUR, PREGSERUM, HCG, HCGQUANT   Assessment/Plan Active Problems:   CYSTOCELE WITH INCOMPLETE UTERINE PROLAPSE   Estrogen deficiency   Rectocele  For TVH/BSO and anterior/posterior repair, vaginal vault suspension. I will perform the TVH/BSO and Dr. Matilde Sprang will perform the rest of the procedure.  Risks include but are not limited to bleeding, infection, injury to surrounding structures, including bowel, bladder and ureters, blood clots, and death.  Likelihood of success is high.    Keeton Kassebaum S 07/04/2015, 4:44 PM

## 2015-07-08 ENCOUNTER — Other Ambulatory Visit: Payer: Self-pay

## 2015-07-08 ENCOUNTER — Encounter (HOSPITAL_COMMUNITY)
Admission: RE | Admit: 2015-07-08 | Discharge: 2015-07-08 | Disposition: A | Discharge status: Home / Self Care | Payer: Medicare Other | Source: Ambulatory Visit | Attending: Family Medicine | Admitting: Family Medicine

## 2015-07-08 ENCOUNTER — Encounter (HOSPITAL_COMMUNITY): Payer: Self-pay

## 2015-07-08 DIAGNOSIS — Z01818 Encounter for other preprocedural examination: Secondary | ICD-10-CM | POA: Diagnosis not present

## 2015-07-08 DIAGNOSIS — N814 Uterovaginal prolapse, unspecified: Secondary | ICD-10-CM | POA: Diagnosis not present

## 2015-07-08 LAB — BASIC METABOLIC PANEL
ANION GAP: 7 (ref 5–15)
BUN: 13 mg/dL (ref 6–20)
CALCIUM: 9.7 mg/dL (ref 8.9–10.3)
CO2: 28 mmol/L (ref 22–32)
CREATININE: 0.8 mg/dL (ref 0.44–1.00)
Chloride: 103 mmol/L (ref 101–111)
Glucose, Bld: 118 mg/dL — ABNORMAL HIGH (ref 65–99)
Potassium: 4.2 mmol/L (ref 3.5–5.1)
SODIUM: 138 mmol/L (ref 135–145)

## 2015-07-08 LAB — CBC
HCT: 40.9 % (ref 36.0–46.0)
HEMOGLOBIN: 13.8 g/dL (ref 12.0–15.0)
MCH: 31.7 pg (ref 26.0–34.0)
MCHC: 33.7 g/dL (ref 30.0–36.0)
MCV: 94 fL (ref 78.0–100.0)
PLATELETS: 250 10*3/uL (ref 150–400)
RBC: 4.35 MIL/uL (ref 3.87–5.11)
RDW: 12.4 % (ref 11.5–15.5)
WBC: 7.2 10*3/uL (ref 4.0–10.5)

## 2015-07-08 LAB — TYPE AND SCREEN
ABO/RH(D): O NEG
ANTIBODY SCREEN: NEGATIVE

## 2015-07-08 LAB — APTT: APTT: 28 s (ref 24–37)

## 2015-07-08 LAB — PROTIME-INR
INR: 1.07 (ref 0.00–1.49)
PROTHROMBIN TIME: 14.1 s (ref 11.6–15.2)

## 2015-07-08 LAB — ABO/RH: ABO/RH(D): O NEG

## 2015-07-08 NOTE — Patient Instructions (Addendum)
   Your procedure is scheduled on: December 27 (tuesday)  Enter through the [employer] of [facility] at: [employer] up the phone at the desk and dial [phone] and inform us of your arrival.  Please call this number if you have any problems the morning of surgery: [phone]  DO NOT EAT OR DRINK AFTER MIDNIGHT (INCLUDING GUM OR CANDY) December 26   Do not wear jewelry, make-up, or FINGER nail polish No metal in your hair or on your body. Do not wear lotions, powders, perfumes.  You may wear deodorant.  Do not bring valuables to the hospital. Contacts, dentures or bridgework may not be worn into surgery.  Leave suitcase in the car. After Surgery it may be brought to your room. For patients being admitted to the hospital, checkout time is 11:00am the day of discharge.

## 2015-07-18 NOTE — Anesthesia Preprocedure Evaluation (Addendum)
Anesthesia Evaluation  Patient identified by MRN, date of birth, ID band Patient awake    Reviewed: Allergy & Precautions, NPO status , Patient's Chart, lab work & pertinent test results  Airway Mallampati: II       Dental  (+) Edentulous Upper, Edentulous Lower   Pulmonary neg pulmonary ROS,    breath sounds clear to auscultation       Cardiovascular negative cardio ROS   Rhythm:Regular     Neuro/Psych negative neurological ROS  negative psych ROS   GI/Hepatic negative GI ROS, Neg liver ROS,   Endo/Other  negative endocrine ROS  Renal/GU negative Renal ROS  negative genitourinary   Musculoskeletal negative musculoskeletal ROS (+)   Abdominal (+)  Abdomen: soft.    Peds negative pediatric ROS (+)  Hematology negative hematology ROS (+) 13/40   Anesthesia Other Findings   Reproductive/Obstetrics negative OB ROS BTL in past                            Anesthesia Physical Anesthesia Plan  ASA: II  Anesthesia Plan: General   Post-op Pain Management:    Induction: Intravenous  Airway Management Planned: Oral ETT  Additional Equipment:   Intra-op Plan:   Post-operative Plan: Extubation in OR  Informed Consent: I have reviewed the patients History and Physical, chart, labs and discussed the procedure including the risks, benefits and alternatives for the proposed anesthesia with the patient or authorized representative who has indicated his/her understanding and acceptance.     Plan Discussed with:   Anesthesia Plan Comments:         Anesthesia Quick Evaluation

## 2015-07-18 NOTE — H&P (Signed)
History of Present Illness   I was consulted by Dr Loura Pardon regarding Chloe Ali's prolapse that has worsened over approximately 2 years. She feels a bulge in the vagina almost like a bowling ball. She does not reduce it. She has not had a hysterectomy. Her bowel function is normal and sometimes she takes laxatives.   She is continent and can void 3 times a day and generally does not get up at night unless she drinks fluid and then she will get up twice. She reports good flow.    When I saw Chloe Ali last time she was leaking not associated awareness, and she had a pessary, but I could not determine if she had stress or urge incontinence. She had a grade 3 cystocele with central defect but could not cough or bear down hard. Her cervix descended 3-4 cm. She had a very large posterior defect noted. She had a deficiency near the posterior facet and a fair amount of peroneal bulging.   I thought if she ever had surgery she would likely best benefit from a transvaginal hysterectomy, cystocele repair and graft, vault suspension, rectocele repair. I thought her tissues were somewhat elastic.  She did not void and was catheterized for 450 mL. Initial residual was 82 mL. Max capacity was 850 mL. Bladder was unstable reaching pressures of 8 cmH2O, but she did not leak. Importantly, she did not leak with Valsalva pressure of 114 cmH2O. She did have stress incontinence with or without prolapse reduced. During voluntary voiding she voided 365 mL with a maximum flow of 8 mL/second. She did not empty efficiently. Maximum voiding pressure was 35 cmH2O. She had an interrupted flow pattern. She really tried for a long time to empty before she was able to do so. EMG activity was normal. Bladder neck descended 2-3 cm. Her cystocele was not that impressive. Bladder was hyposensitive. Details of the urodynamics are signed and dictated on the urodynamics sheet.    Past Medical History Problems  1. History of  hypercholesterolemia (Z86.39)  Surgical History Problems  1. History of Tubal Ligation  Current Meds 1. No Reported Medications Recorded  Allergies Medication  1. No Known Drug Allergies  Family History Problems  1. Family history of Death of family member : Mother, Father   mother passed @ age 47-cancerfather passed @ age 74-cancer  Social History Problems  1. Alcohol use (Z78.9)   1-2 drinks daily 2. Denied: History of Caffeine use 3. Married 4. Never a smoker 5. Number of children   2 sons2 daughters 6. Occupation   homemaker  Results/Data  Urine [Data Includes: Last 1 Day]   [phone]  COLOR YELLOW   APPEARANCE CLEAR   SPECIFIC GRAVITY 1.020   pH 5.5   GLUCOSE NEGATIVE   BILIRUBIN NEGATIVE   KETONE NEGATIVE   BLOOD NEGATIVE   PROTEIN NEGATIVE   NITRITE NEGATIVE   LEUKOCYTE ESTERASE NEGATIVE    Assessment Assessed  1. Cystocele (N81.10) 2. Uterovaginal prolapse (N81.4)  Plan Cystocele  1. Gynecology Referral Referral  Referral  Status: Hold For - Appointment,Records   Requested for: 27Jul2016 Cystocele, Nocturia, Urinary incontinence without sensory awareness  2. Follow-up PRN Office  Follow-up  Status: Complete  Done: [id number]  Discussion/Summary   I drew Chloe Ali a picture. I talked about a transvaginal hysterectomy with cystocele repair and graft and vault suspension and rectocele repair. She has already had a pessary. Watchful waiting discussed.  I drew her a picture and we talked about  prolapse surgery in detail. Pros, cons, general surgical and anesthetic risks, and other options including behavioral therapy, pessaries, and watchful waiting were discussed. She understands that prolapse repairs are successful in 80-85% of cases for prolapse symptoms and can recur anteriorly, posteriorly, and/or apically. She understands that in most cases I use a graft and general risks were discussed. Surgical risks were described but not limited to the  discussion of injury to neighboring structures including the bowel (with possible life-threatening sepsis and colostomy), bladder, urethra, vagina (all resulting in further surgery), and ureter (resulting in re-implantation). We talked about injury to nerves/soft tissue leading to debilitating and intractable pelvic, abdominal, and lower extremity pain syndromes and neuropathies. The risks of buttock pain, intractable dyspareunia, and vaginal narrowing and shortening with sequelae were discussed. Bleeding risks, transfusion rates, and infection were discussed. The risk of persistent, de novo, or worsening bladder and/or bowel incontinence/dysfunction was discussed. The need for CIC was described as well the usual post-operative course. The patient understands that she might not reach her treatment goal and that she might be worse following surgery.  We talked about mesh issues. She understands I do not recommend a sling for a number of reasons, and the leaking with the pessary was addressed. This may have been due to overactivity. Delayed sling discussed.   Chloe Ali is sexually active. She may have a little bit of irritable bowel syndrome and constipation. 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 Sometimes at the end of the day the prolapse is a little bit aching. She will see Dr Garwin Brothers, and I will wait to hear from Dr Garwin Brothers if she would like to proceed with surgery.  After a thorough review of the management options for the patient's condition the patient  elected to proceed with surgical therapy as noted above. We have discussed the potential benefits and risks of the procedure, side effects of the proposed treatment, the likelihood of the patient achieving the goals of the procedure, and any potential problems that might occur during the procedure or recuperation. Informed consent has been obtained.

## 2015-07-21 MED ORDER — CEFAZOLIN SODIUM-DEXTROSE 2-3 GM-% IV SOLR
2.0000 g | INTRAVENOUS | Status: AC
  Administered 2015-07-22: 2 g via INTRAVENOUS

## 2015-07-22 ENCOUNTER — Ambulatory Visit (HOSPITAL_COMMUNITY)
Admission: RE | Admit: 2015-07-22 | Discharge: 2015-07-23 | Disposition: A | Discharge status: Home / Self Care | Payer: Medicare Other | Source: Ambulatory Visit | Attending: Family Medicine | Admitting: Family Medicine

## 2015-07-22 ENCOUNTER — Encounter (HOSPITAL_COMMUNITY)
Admission: RE | Disposition: A | Discharge status: Home / Self Care | Payer: Self-pay | Source: Ambulatory Visit | Attending: Family Medicine

## 2015-07-22 ENCOUNTER — Ambulatory Visit (HOSPITAL_COMMUNITY): Payer: Medicare Other | Admitting: Anesthesiology

## 2015-07-22 ENCOUNTER — Encounter (HOSPITAL_COMMUNITY): Payer: Self-pay

## 2015-07-22 DIAGNOSIS — R351 Nocturia: Secondary | ICD-10-CM | POA: Diagnosis not present

## 2015-07-22 DIAGNOSIS — N879 Dysplasia of cervix uteri, unspecified: Secondary | ICD-10-CM | POA: Diagnosis not present

## 2015-07-22 DIAGNOSIS — D259 Leiomyoma of uterus, unspecified: Secondary | ICD-10-CM | POA: Diagnosis not present

## 2015-07-22 DIAGNOSIS — N816 Rectocele: Secondary | ICD-10-CM | POA: Diagnosis present

## 2015-07-22 DIAGNOSIS — N393 Stress incontinence (female) (male): Secondary | ICD-10-CM | POA: Insufficient documentation

## 2015-07-22 DIAGNOSIS — N8110 Cystocele, unspecified: Secondary | ICD-10-CM | POA: Diagnosis not present

## 2015-07-22 DIAGNOSIS — E785 Hyperlipidemia, unspecified: Secondary | ICD-10-CM | POA: Diagnosis not present

## 2015-07-22 DIAGNOSIS — N812 Incomplete uterovaginal prolapse: Secondary | ICD-10-CM | POA: Diagnosis not present

## 2015-07-22 DIAGNOSIS — Z90710 Acquired absence of both cervix and uterus: Secondary | ICD-10-CM | POA: Diagnosis present

## 2015-07-22 DIAGNOSIS — N814 Uterovaginal prolapse, unspecified: Secondary | ICD-10-CM | POA: Diagnosis present

## 2015-07-22 DIAGNOSIS — E2839 Other primary ovarian failure: Secondary | ICD-10-CM | POA: Diagnosis present

## 2015-07-22 DIAGNOSIS — N858 Other specified noninflammatory disorders of uterus: Secondary | ICD-10-CM | POA: Diagnosis not present

## 2015-07-22 HISTORY — PX: CYSTO: SHX6284

## 2015-07-22 HISTORY — PX: ANTERIOR AND POSTERIOR REPAIR: SHX5121

## 2015-07-22 HISTORY — PX: VAGINAL HYSTERECTOMY: SHX2639

## 2015-07-22 SURGERY — HYSTERECTOMY, VAGINAL
Anesthesia: General | Site: Vagina

## 2015-07-22 MED ORDER — FLEET ENEMA 7-19 GM/118ML RE ENEM: 1.0000 | ENEMA | Freq: Once | RECTAL | Status: DC

## 2015-07-22 MED ORDER — DIPHENHYDRAMINE HCL 50 MG/ML IJ SOLN
INTRAMUSCULAR | Status: DC | PRN
  Administered 2015-07-22: 12.5 mg via INTRAVENOUS

## 2015-07-22 MED ORDER — DEXTROSE IN LACTATED RINGERS 5 % IV SOLN
INTRAVENOUS | Status: DC
  Administered 2015-07-22 – 2015-07-23 (×2): via INTRAVENOUS

## 2015-07-22 MED ORDER — NEOSTIGMINE METHYLSULFATE 10 MG/10ML IV SOLN
INTRAVENOUS | Status: AC
  Filled 2015-07-22: qty 1

## 2015-07-22 MED ORDER — FENTANYL CITRATE (PF) 100 MCG/2ML IJ SOLN
INTRAMUSCULAR | Status: DC | PRN
  Administered 2015-07-22: 25 ug via INTRAVENOUS
  Administered 2015-07-22 (×2): 50 ug via INTRAVENOUS
  Administered 2015-07-22 (×2): 25 ug via INTRAVENOUS
  Administered 2015-07-22: 50 ug via INTRAVENOUS
  Administered 2015-07-22: 25 ug via INTRAVENOUS

## 2015-07-22 MED ORDER — PROMETHAZINE HCL 25 MG/ML IJ SOLN: 6.2500 mg | INTRAMUSCULAR | Status: DC | PRN

## 2015-07-22 MED ORDER — LIDOCAINE-EPINEPHRINE (PF) 1 %-1:200000 IJ SOLN
INTRAMUSCULAR | Status: AC
  Filled 2015-07-22: qty 30

## 2015-07-22 MED ORDER — GLYCOPYRROLATE 0.2 MG/ML IJ SOLN
INTRAMUSCULAR | Status: DC | PRN
  Administered 2015-07-22: 0.6 mg via INTRAVENOUS

## 2015-07-22 MED ORDER — CEFAZOLIN SODIUM-DEXTROSE 2-3 GM-% IV SOLR
INTRAVENOUS | Status: AC
  Filled 2015-07-22: qty 50

## 2015-07-22 MED ORDER — LIDOCAINE HCL (CARDIAC) 20 MG/ML IV SOLN
INTRAVENOUS | Status: AC
  Filled 2015-07-22: qty 5

## 2015-07-22 MED ORDER — DEXAMETHASONE SODIUM PHOSPHATE 4 MG/ML IJ SOLN
INTRAMUSCULAR | Status: AC
  Filled 2015-07-22: qty 1

## 2015-07-22 MED ORDER — DEXAMETHASONE SODIUM PHOSPHATE 10 MG/ML IJ SOLN
INTRAMUSCULAR | Status: DC | PRN
  Administered 2015-07-22: 4 mg via INTRAVENOUS

## 2015-07-22 MED ORDER — LIDOCAINE HCL (CARDIAC) 20 MG/ML IV SOLN
INTRAVENOUS | Status: DC | PRN
  Administered 2015-07-22: 60 mg via INTRAVENOUS

## 2015-07-22 MED ORDER — STERILE WATER FOR IRRIGATION IR SOLN
Status: DC | PRN
  Administered 2015-07-22: 1000 mL

## 2015-07-22 MED ORDER — PROPOFOL 10 MG/ML IV BOLUS
INTRAVENOUS | Status: DC | PRN
  Administered 2015-07-22: 160 mg via INTRAVENOUS

## 2015-07-22 MED ORDER — FENTANYL CITRATE (PF) 100 MCG/2ML IJ SOLN: 25.0000 ug | INTRAMUSCULAR | Status: DC | PRN

## 2015-07-22 MED ORDER — KETOROLAC TROMETHAMINE 30 MG/ML IJ SOLN
INTRAMUSCULAR | Status: DC | PRN
  Administered 2015-07-22: 30 mg via INTRAVENOUS

## 2015-07-22 MED ORDER — GENTAMICIN SULFATE 40 MG/ML IJ SOLN
5.0000 mg/kg | INTRAVENOUS | Status: DC
  Administered 2015-07-22 – 2015-07-23 (×2): 280 mg via INTRAVENOUS
  Filled 2015-07-22 (×3): qty 7

## 2015-07-22 MED ORDER — ONDANSETRON HCL 4 MG/2ML IJ SOLN
INTRAMUSCULAR | Status: AC
  Filled 2015-07-22: qty 2

## 2015-07-22 MED ORDER — LIDOCAINE-EPINEPHRINE 1 %-1:100000 IJ SOLN
INTRAMUSCULAR | Status: AC
  Filled 2015-07-22: qty 1

## 2015-07-22 MED ORDER — MENTHOL 3 MG MT LOZG: 1.0000 | LOZENGE | OROMUCOSAL | Status: DC | PRN

## 2015-07-22 MED ORDER — LACTATED RINGERS IV SOLN
INTRAVENOUS | Status: DC
  Administered 2015-07-22 (×2): via INTRAVENOUS

## 2015-07-22 MED ORDER — EPHEDRINE 5 MG/ML INJ
INTRAVENOUS | Status: AC
  Filled 2015-07-22: qty 10

## 2015-07-22 MED ORDER — PHENAZOPYRIDINE HCL 200 MG PO TABS
200.0000 mg | ORAL_TABLET | Freq: Once | ORAL | Status: AC
  Administered 2015-07-22: 200 mg via ORAL
  Filled 2015-07-22: qty 1

## 2015-07-22 MED ORDER — MIDAZOLAM HCL 2 MG/2ML IJ SOLN
INTRAMUSCULAR | Status: AC
  Filled 2015-07-22: qty 2

## 2015-07-22 MED ORDER — MEPERIDINE HCL 25 MG/ML IJ SOLN: 6.2500 mg | INTRAMUSCULAR | Status: DC | PRN

## 2015-07-22 MED ORDER — OXYCODONE-ACETAMINOPHEN 5-325 MG PO TABS
1.0000 | ORAL_TABLET | ORAL | Status: DC | PRN
  Administered 2015-07-22 – 2015-07-23 (×2): 1 via ORAL
  Filled 2015-07-22 (×2): qty 1

## 2015-07-22 MED ORDER — IBUPROFEN 600 MG PO TABS: 600.0000 mg | ORAL_TABLET | Freq: Four times a day (QID) | ORAL | Status: DC | PRN

## 2015-07-22 MED ORDER — GLYCOPYRROLATE 0.2 MG/ML IJ SOLN
INTRAMUSCULAR | Status: AC
Start: 2015-07-22 — End: 2015-07-22
  Filled 2015-07-22: qty 3

## 2015-07-22 MED ORDER — FENTANYL CITRATE (PF) 250 MCG/5ML IJ SOLN
INTRAMUSCULAR | Status: AC
  Filled 2015-07-22: qty 5

## 2015-07-22 MED ORDER — KETOROLAC TROMETHAMINE 15 MG/ML IJ SOLN
15.0000 mg | Freq: Four times a day (QID) | INTRAMUSCULAR | Status: DC
  Filled 2015-07-22 (×8): qty 1

## 2015-07-22 MED ORDER — ESTRADIOL 0.1 MG/GM VA CREA
TOPICAL_CREAM | VAGINAL | Status: AC
  Filled 2015-07-22: qty 42.5

## 2015-07-22 MED ORDER — PROPOFOL 10 MG/ML IV BOLUS
INTRAVENOUS | Status: AC
  Filled 2015-07-22: qty 20

## 2015-07-22 MED ORDER — ROCURONIUM BROMIDE 100 MG/10ML IV SOLN
INTRAVENOUS | Status: DC | PRN
  Administered 2015-07-22 (×2): 10 mg via INTRAVENOUS
  Administered 2015-07-22: 40 mg via INTRAVENOUS
  Administered 2015-07-22: 20 mg via INTRAVENOUS
  Administered 2015-07-22 (×2): 10 mg via INTRAVENOUS

## 2015-07-22 MED ORDER — ONDANSETRON HCL 4 MG/2ML IJ SOLN
INTRAMUSCULAR | Status: DC | PRN
  Administered 2015-07-22: 4 mg via INTRAVENOUS

## 2015-07-22 MED ORDER — SCOPOLAMINE 1 MG/3DAYS TD PT72
MEDICATED_PATCH | TRANSDERMAL | Status: AC
  Administered 2015-07-22: 1.5 mg
  Filled 2015-07-22: qty 1

## 2015-07-22 MED ORDER — KETOROLAC TROMETHAMINE 30 MG/ML IJ SOLN
INTRAMUSCULAR | Status: AC
  Filled 2015-07-22: qty 1

## 2015-07-22 MED ORDER — LIDOCAINE-EPINEPHRINE (PF) 1 %-1:200000 IJ SOLN
INTRAMUSCULAR | Status: DC | PRN
  Administered 2015-07-22: 20 mL
  Administered 2015-07-22: 30 mL
  Administered 2015-07-22: 20 mL

## 2015-07-22 MED ORDER — SODIUM CHLORIDE 0.9 % IR SOLN
Status: DC | PRN
  Administered 2015-07-22: 500 mL

## 2015-07-22 MED ORDER — KETOROLAC TROMETHAMINE 30 MG/ML IJ SOLN: 30.0000 mg | Freq: Once | INTRAMUSCULAR | Status: DC

## 2015-07-22 MED ORDER — ESTRADIOL 0.1 MG/GM VA CREA
TOPICAL_CREAM | VAGINAL | Status: DC | PRN
  Administered 2015-07-22: 2 via VAGINAL

## 2015-07-22 MED ORDER — SODIUM CHLORIDE 0.9 % IR SOLN
Freq: Once | Status: DC
  Filled 2015-07-22: qty 1

## 2015-07-22 MED ORDER — KETOROLAC TROMETHAMINE 15 MG/ML IJ SOLN
15.0000 mg | Freq: Four times a day (QID) | INTRAMUSCULAR | Status: DC
  Administered 2015-07-22 – 2015-07-23 (×3): 15 mg via INTRAVENOUS
  Filled 2015-07-22 (×8): qty 1

## 2015-07-22 MED ORDER — LACTATED RINGERS IV SOLN
INTRAVENOUS | Status: DC
  Administered 2015-07-22: 07:00:00 via INTRAVENOUS

## 2015-07-22 MED ORDER — NEOSTIGMINE METHYLSULFATE 10 MG/10ML IV SOLN
INTRAVENOUS | Status: DC | PRN
  Administered 2015-07-22: 4 mg via INTRAVENOUS

## 2015-07-22 MED ORDER — EPHEDRINE SULFATE 50 MG/ML IJ SOLN
INTRAMUSCULAR | Status: DC | PRN
  Administered 2015-07-22: 5 mg via INTRAVENOUS
  Administered 2015-07-22: 10 mg via INTRAVENOUS
  Administered 2015-07-22: 15 mg via INTRAVENOUS
  Administered 2015-07-22: 10 mg via INTRAVENOUS
  Administered 2015-07-22: 15 mg via INTRAVENOUS
  Administered 2015-07-22: 10 mg via INTRAVENOUS

## 2015-07-22 SURGICAL SUPPLY — 58 items
ALLOGRAFT TUTOPLAST AXIS 6X12 (Tissue) ×2 IMPLANT
BAG URINE DRAINAGE (UROLOGICAL SUPPLIES) ×3 IMPLANT
BLADE SURG 15 STRL LF C SS BP (BLADE) ×3 IMPLANT
BLADE SURG 15 STRL SS (BLADE) ×4
CANISTER SUCT 3000ML (MISCELLANEOUS) ×8 IMPLANT
CATH FOLEY 2WAY SLVR  5CC 16FR (CATHETERS) ×1
CATH FOLEY 2WAY SLVR 5CC 16FR (CATHETERS) ×2 IMPLANT
CATH ROBINSON RED A/P 16FR (CATHETERS) ×3 IMPLANT
CLOTH BEACON ORANGE TIMEOUT ST (SAFETY) ×4 IMPLANT
CONT PATH 16OZ SNAP LID 3702 (MISCELLANEOUS) IMPLANT
DECANTER SPIKE VIAL GLASS SM (MISCELLANEOUS) ×6 IMPLANT
DEVICE CAPIO SLIM SINGLE (INSTRUMENTS) ×3 IMPLANT
DRAIN PENROSE 1/4X12 LTX (DRAIN) ×4 IMPLANT
DRAPE UNDERBUTTOCKS STRL (DRAPE) ×4 IMPLANT
GAUZE PACKING 2X5 YD STRL (GAUZE/BANDAGES/DRESSINGS) ×3 IMPLANT
GAUZE SPONGE 4X4 16PLY XRAY LF (GAUZE/BANDAGES/DRESSINGS) ×11 IMPLANT
GLOVE BIO SURGEON STRL SZ7.5 (GLOVE) ×4 IMPLANT
GLOVE BIO SURGEON STRL SZ8 (GLOVE) ×8 IMPLANT
GLOVE BIOGEL PI IND STRL 6.5 (GLOVE) ×3 IMPLANT
GLOVE BIOGEL PI IND STRL 7.0 (GLOVE) ×9 IMPLANT
GLOVE BIOGEL PI INDICATOR 6.5 (GLOVE) ×1
GLOVE BIOGEL PI INDICATOR 7.0 (GLOVE) ×3
GLOVE ECLIPSE 7.0 STRL STRAW (GLOVE) ×8 IMPLANT
GOWN STRL REUS W/TWL LRG LVL3 (GOWN DISPOSABLE) ×36 IMPLANT
LIQUID BAND (GAUZE/BANDAGES/DRESSINGS) IMPLANT
NDL MAYO 6 CRC TAPER PT (NEEDLE) IMPLANT
NDL SPNL 18GX3.5 QUINCKE PK (NEEDLE) ×1 IMPLANT
NEEDLE HYPO 22GX1.5 SAFETY (NEEDLE) ×4 IMPLANT
NEEDLE MAYO 6 CRC TAPER PT (NEEDLE) IMPLANT
NEEDLE SPNL 18GX3.5 QUINCKE PK (NEEDLE) ×4 IMPLANT
NS IRRIG 1000ML POUR BTL (IV SOLUTION) ×8 IMPLANT
PACK VAGINAL WOMENS (CUSTOM PROCEDURE TRAY) ×4 IMPLANT
PAD OB MATERNITY 4.3X12.25 (PERSONAL CARE ITEMS) ×4 IMPLANT
PENCIL BUTTON HOLSTER BLD 10FT (ELECTRODE) ×4 IMPLANT
PLUG CATH AND CAP STER (CATHETERS) ×4 IMPLANT
RETRACTOR STAY HOOK 5MM (MISCELLANEOUS) ×4 IMPLANT
SET CYSTO W/LG BORE CLAMP LF (SET/KITS/TRAYS/PACK) ×4 IMPLANT
SHEET LAVH (DRAPES) ×4 IMPLANT
SUT CAPIO ETHIBPND (SUTURE) ×3 IMPLANT
SUT SILK 2 0 FS (SUTURE) IMPLANT
SUT VIC AB 0 CT1 18XCR BRD8 (SUTURE) ×7 IMPLANT
SUT VIC AB 0 CT1 27 (SUTURE) ×12
SUT VIC AB 0 CT1 27XBRD ANBCTR (SUTURE) ×8 IMPLANT
SUT VIC AB 0 CT1 8-18 (SUTURE) ×8
SUT VIC AB 0 CT2 27 (SUTURE) IMPLANT
SUT VIC AB 2-0 CT1 (SUTURE) ×8 IMPLANT
SUT VIC AB 2-0 SH 27 (SUTURE) ×24
SUT VIC AB 2-0 SH 27XBRD (SUTURE) ×16 IMPLANT
SUT VIC AB 3-0 PS2 18 (SUTURE)
SUT VIC AB 3-0 PS2 18XBRD (SUTURE) IMPLANT
SUT VICRYL 0 TIES 12 18 (SUTURE) ×4 IMPLANT
SUT VICRYL 0 UR6 27IN ABS (SUTURE) IMPLANT
SYR 20CC LL (SYRINGE) ×4 IMPLANT
TOWEL OR 17X24 6PK STRL BLUE (TOWEL DISPOSABLE) ×16 IMPLANT
TRAY FOLEY CATH SILVER 14FR (SET/KITS/TRAYS/PACK) ×5 IMPLANT
TUBING NON-CON 1/4 X 20 CONN (TUBING) ×4 IMPLANT
TUTOPLAST AXIS 6X12 (Tissue) ×4 IMPLANT
WATER STERILE IRR 1000ML POUR (IV SOLUTION) ×8 IMPLANT

## 2015-07-22 NOTE — Op Note (Signed)
Preoperative diagnosis: Cystocele and rectocele and mild vault prolapse Postoperative diagnosis cystocele and rectocele and mild vault prolapse Surgery: Cystocele repair and rectocele repair and cystoscopy Surgeon: Dr. Nicki Reaper Samson Ralph Assistant: Dr. Harvel Ricks  The patient had a transvaginal hysterectomy. The vaginal cuff was open. The ureteral sacral ligaments were tagged by Dr. Kennon Rounds. She approximated in the midline the peritoneum to the posterior vaginal wall. The ureteral sacral ligaments were actually quite strong especially on the patient's right side. The patient had a large grade 2 cystocele or arguably a small grade 3 cystocele with central defect.  I cystoscoped the patient and there was no bladder injury and there was excellent yellow jets bilaterally  I instilled 20 mL of a lidocaine epinephrine mixture. With my Allis clamp technique I made a long anterior vaginal wall incision and sharply dissected the overlying anterior vaginal wall from the underlying pubocervical fascia to the white line bilaterally. I was pleased with the apical mobilization. She had a little bit of a narrow pubic arch. She really had a long redundant cystocele relative to her more narrow anatomy.  Without shortening or distorting the anatomy I did a 2 layer anterior repair imbricating the cystocele. I was diligent in not changing her anatomy and not imbricating the bladder neck. I did a cystoscopy and she had an excellent repair endoscopically. There is no distortion of the ureters. There had excellent yellow jets bilaterally  I prepared a 12 x 6 graft to 10 cm and then in the shape of a trapezoid.  Over the next 15 minutes or so I had finger dissected to the left ischial spine. She had a lot of thickening of tissue in that area. I had noted it near the vaginal apex at the beginning of the case. In spite of gentle but firm pressure I was never able to break through the plane to appropriately sweep tissues  medially. I did not want to cause injury and i felt it was not safe to do the sacrospinous fixation. Because I did not do the left I did not do the right side  I trimmed an appropriate amount of anterior vaginal wall mucosa and closed the anterior vaginal wall with running 2-0 Vicryl on a CT1 needle. She really had excellent anterior support and length. I closed the vaginal cuff from left to right and right to left with 0 Vicryl on a running suture. I reapproximated the high ureteral sacral ligaments. I cut the suture in the midline. Again she had excellent support and length.  She had a very short perineum. She had deficiency of the posterior fourchette sometimes expelling the weighted vaginal speculum. I placed the Allis clamp on the hymenal ring remnant bilaterally and removed a small triangle perineal skin. I instilled 20 mL of a lidocaine epinephrine mixture. The dissection was a little bit more difficult because her length was so deep. Between my Allis clamps  i made a long posterior incision and sharply mobilized the thin overlying vaginal mucosa from the underlying rectovaginal fascia. I did a rectal examination and she had an apical defect and diffuse thinning of her rectovaginal mucosa. There was no question she had a lot of elasticity of the fascia and perineum.  I did a 2 layer posterior repair with running 2-0 Vicryl on an SH needle. I double checked the rectal examination and she had an excellent repair and there was no rectal injury. I trimmed an appropriate amount of posterior vaginal mucosa and closed the posterior vaginal wall  with running 2-0 Vicryl and CT1 needle. It was brought to the exterior at the introitus. I did 1 gentle 0 Vicryl perineal body repair. I then closed the perineal skin with subcuticular suture.  Again she had excellent support with a repeat rectal examination and very good length. She had less than 50 mL of blood loss. Leg position was good. 1-1/2 vaginal packs with  Estrace cream was utilized. She had tremendous urine output of yellow urine throughout the case  I qas very pleased with her surgery and hopefully she will reach her treatment goal. She does have a lot of elasticity to her tissues.

## 2015-07-22 NOTE — Interval H&P Note (Signed)
History and Physical Interval Note:  07/22/2015 7:17 AM  Chloe Ali  has presented today for surgery, with the diagnosis of  - Uterine prolapse CYSTOCELE RECTOCEL VAULT PROPLAPSE  The various methods of treatment have been discussed with the patient and family. After consideration of risks, benefits and other options for treatment, the patient has consented to  Procedure(s) with comments: HYSTERECTOMY VAGINAL (N/A) - Requested 07/22/15 @ 7:30a with Dr. Rogue Bussing to follow SALPINGO OOPHORECTOMY (Bilateral) CYSTO (N/A) ANTERIOR (CYSTOCELE) AND POSTERIOR REPAIR (RECTOCELE) (N/A) VAGINAL VAULT SUSPENSION AND GRAFT (N/A) as a surgical intervention .  The patient's history has been reviewed, patient examined, no change in status, stable for surgery.  I have reviewed the patient's chart and labs.  Questions were answered to the patient's satisfaction.     [facility]

## 2015-07-22 NOTE — Transfer of Care (Signed)
Immediate Anesthesia Transfer of Care Note  Patient: Chloe Ali  Procedure(s) Performed: Procedure(s) with comments: HYSTERECTOMY VAGINAL (N/A) - Requested 07/22/15 @ 7:30a with Dr. Rogue Bussing to follow CYSTO (N/A) ANTERIOR (CYSTOCELE) AND POSTERIOR REPAIR (RECTOCELE) (N/A)  Patient Location: PACU  Anesthesia Type:General  Level of Consciousness: awake, sedated and patient cooperative  Airway & Oxygen Therapy: Patient Spontanous Breathing and Patient connected to nasal cannula oxygen  Post-op Assessment: Report given to RN and Post -op Vital signs reviewed and stable  Post vital signs: Reviewed and stable  Last Vitals:  Filed Vitals:   07/22/15 0605  BP: 142/73  Pulse: 65  Temp: 36.4 C  Resp: 20    Complications: No apparent anesthesia complications

## 2015-07-22 NOTE — Anesthesia Procedure Notes (Signed)
Procedure Name: Intubation Date/Time: 07/22/2015 7:34 AM Performed by: Tobin Chad Pre-anesthesia Checklist: Patient identified, Timeout performed, Emergency Drugs available, Suction available and Patient being monitored Patient Re-evaluated:Patient Re-evaluated prior to inductionOxygen Delivery Method: Circle system utilized and Simple face mask Preoxygenation: Pre-oxygenation with 100% oxygen Ventilation: Mask ventilation without difficulty Laryngoscope Size: Mac and 3 Grade View: Grade II Tube type: Oral Number of attempts: 1 Airway Equipment and Method: Stylet Placement Confirmation: ETT inserted through vocal cords under direct vision,  positive ETCO2 and CO2 detector Secured at: 21 cm Tube secured with: Tape Dental Injury: Teeth and Oropharynx as per pre-operative assessment

## 2015-07-22 NOTE — Anesthesia Postprocedure Evaluation (Signed)
Anesthesia Post Note  Patient: Chloe Ali  Procedure(s) Performed: Procedure(s) (LRB): HYSTERECTOMY VAGINAL (N/A) CYSTO (N/A) ANTERIOR (CYSTOCELE) AND POSTERIOR REPAIR (RECTOCELE) (N/A)  Patient location during evaluation: PACU Anesthesia Type: General Level of consciousness: awake and alert Pain management: pain level controlled Vital Signs Assessment: post-procedure vital signs reviewed and stable Respiratory status: spontaneous breathing, nonlabored ventilation, respiratory function stable and patient connected to nasal cannula oxygen Cardiovascular status: blood pressure returned to baseline and stable Postop Assessment: no signs of nausea or vomiting Anesthetic complications: no    Last Vitals:  Filed Vitals:   07/22/15 1145 07/22/15 1200  BP: 128/71 125/67  Pulse: 98 77  Temp: 36.6 C   Resp: 16 15    Last Pain: There were no vitals filed for this visit.               Kaaren Nass

## 2015-07-22 NOTE — Op Note (Signed)
Preoperative diagnosis: uterine prolapse, rectocele, cystocele   Postoperative diagnosis: Same  Procedure: Transvaginal hysterectomy   Surgeon: Standley Dakins. Kennon Rounds, M.D.  Assistant: Emeterio Reeve, MD  Anesthesia: General ETT,Theodore Tresa Moore, MD, MD  Findings: large cystocele, rectocele, small uterus, non-visualized tubes and ovaries.  Estimated blood loss: 100 cc  Specimen: Uterus to pathology  Reason for procedure: Patient had long h/o feeling bulge in pelvis and has not been satisfied with pessary treatment. The patient desires definitive treatment.  Risks of  hysterectomy reviewed.  Risks include but are not limited to bleeding, infection, injury to surrounding structures, including bowel, bladder and ureters, blood clots, and death.  Likelihood of success of surgery is high.   Procedure: Patient was taken to the OR where she was placed in dorsal lithotomy in [place]. She was prepped and draped in the usual sterile fashion. A timeout was performed. The patient received 2 g of Ancef prior to procedure. The patient had SCDs in place.  A speculum was placed inside the vagina. The cervix was visualized and grasped with 2 doublle-tooth tenacula. 30 cc of 1% lidocaine with epinephrine were injected paracervically. A knife was used to make a circumferential incision around the vagina. An opened sponge was used to dissect the vagina off the cervix. The posterior peritoneum was entered sharply with the initial cut. The posterior peritoneum was tagged to the vaginal cuff with a single stitch. The anterior peritoneal cavity was entered sharply with careful dissection of the bladder off the underlying cervix. A Heaney clamp was used to clamp first the left uterosacral ligament and cardinal which was then cut and Haney suture ligated with 0 Vicryl stitch, the stitch was held. A simiilar process was carried out on the right.The uterine blood supply was clamped with a Heaney and suture ligated bilaterally.   The right and left utero-ovarian pedicle was grasped with the Heaney clamp, cut and ligated. The right and left tubes and ovaries could not be found and were left in situ. At this point, surgery was assumed by Dr. Matilde Sprang - please see his note for further details. All instrument, needle and lap counts were correct x 2.   Donnamae Jude, MD 07/22/2015, 8:37 AM

## 2015-07-23 ENCOUNTER — Encounter (HOSPITAL_COMMUNITY): Payer: Self-pay | Admitting: Family Medicine

## 2015-07-23 DIAGNOSIS — N812 Incomplete uterovaginal prolapse: Secondary | ICD-10-CM | POA: Diagnosis not present

## 2015-07-23 MED ORDER — IBUPROFEN 600 MG PO TABS: 600.0000 mg | ORAL_TABLET | Freq: Four times a day (QID) | ORAL | Status: DC | PRN

## 2015-07-23 MED ORDER — OXYCODONE-ACETAMINOPHEN 5-325 MG PO TABS: 1.0000 | ORAL_TABLET | Freq: Four times a day (QID) | ORAL | Status: DC | PRN

## 2015-07-23 NOTE — Progress Notes (Signed)
Dr. Valeta Harms  Notified of pt voiding trails .Orders were received and carried. "14 french foley was inserted as ordered. Pt tolerated procedure well.. Later instructions were given on foley home care.

## 2015-07-23 NOTE — Discharge Instructions (Signed)
Vaginal Hysterectomy, Care After Refer to this sheet in the next few weeks. These instructions provide you with information on caring for yourself after your procedure. Your health care provider may also give you more specific instructions. Your treatment has been planned according to current medical practices, but problems sometimes occur. Call your health care provider if you have any problems or questions after your procedure.  WHAT TO EXPECT AFTER THE PROCEDURE After your procedure, it is typical to have the following:  Pain.  Feeling tired.  Poor appetite.  Less interest in sex. It takes 4-6 weeks to recover from this surgery.  HOME CARE INSTRUCTIONS   Take pain medicines only as directed by your health care provider. Do not take over-the-counter pain medicines without checking with your health care provider first.  Take showers instead of baths for 2-3 weeks. Ask your health care provider when it is safe to start showering.  Do not douche, use tampons, or have sexual intercourse for at least 6 weeks or until your health care provider says you can.   Follow your health care provider's advice about exercise, lifting, driving, and general activities.  Get plenty of rest and sleep.   Do not lift anything heavier than (about 20 lb [4.5 kg]) for the first month after surgery.  You can resume your normal diet if your health care provider says it is okay.   Do not drink alcohol while taking pain meds.   If you are constipated, ask your health care provider if you can take a mild laxative.  Eating foods high in fiber may also help with constipation. Eat plenty of raw fruits and vegetables, whole grains, and beans.  Drink enough fluids to keep your urine clear or pale yellow.   Try to have someone at home with you for the first 1-2 weeks to help around the house.  Keep all follow-up appointments. SEEK MEDICAL CARE IF:   You have chills or fever.  You feel dizzy or  light-headed.   You have pain or bleeding when you urinate.   You have persistent diarrhea.   You have persistent nausea and vomiting.   You have abnormal vaginal discharge.   You have a rash.   You have any type of abnormal reaction or develop an allergy to your medicine.   Your pain medicine is not helping.  SEEK IMMEDIATE MEDICAL CARE IF:   You have a fever and your symptoms suddenly get worse.  You have severe abdominal pain.  You have chest pain.  You have shortness of breath.  You faint.  You have pain, swelling, or redness of your leg.  You have heavy vaginal bleeding with blood clots. MAKE SURE YOU:  Understand these instructions.  Will watch your condition.  Will get help right away if you are not doing well or get worse.   This information is not intended to replace advice given to you by your health care provider. Make sure you discuss any questions you have with your health care provider.   Document Released: 01/29/2005 Document Revised: 08/02/2014 Document Reviewed: 05/04/2013 Elsevier Interactive Patient Education [employer].

## 2015-07-23 NOTE — Addendum Note (Signed)
Addendum  created 07/23/15 [id number] by Riki Sheer, CRNA   Modules edited: Clinical Notes   Clinical Notes:  File: [id number]

## 2015-07-23 NOTE — Discharge Summary (Signed)
Physician Discharge Summary  Patient ID: Chloe Ali MRN: [id number] DOB/AGE: 1945-09-04 69 y.o.  Admit date: 07/22/2015 Discharge date:   Admission Diagnoses:  Principal Problem:   CYSTOCELE WITH INCOMPLETE UTERINE PROLAPSE Active Problems:   Estrogen deficiency   Rectocele   Status post hysterectomy   Discharge Diagnoses:  Same  Past Medical History  Diagnosis Date  . History of chicken pox   . Hay fever   . HLD (hyperlipidemia)     Surgeries: Procedure(s): HYSTERECTOMY VAGINAL CYSTO ANTERIOR (CYSTOCELE) AND POSTERIOR REPAIR (RECTOCELE) on 07/22/2015   Consultants:  Urology  Discharged Condition: Stable  Hospital Course: Chloe Ali is an 69 y.o. female [id number] who was admitted 07/22/2015 and found to have a diagnosis of Uterovaginal prolapse, incomplete.  She were brought to the operating room on 07/22/2015 and underwent the above named procedures.    They were given perioperative antibiotics:  Anti-infectives    Start     Dose/Rate Route Frequency Ordered Stop   07/22/15 0910  polymyxin B 500,000 Units, bacitracin 50,000 Units in sodium chloride irrigation 0.9 % 500 mL irrigation  Status:  Discontinued       As needed 07/22/15 0910 07/22/15 1145   07/22/15 0730  polymyxin B 500,000 Units, bacitracin 50,000 Units in sodium chloride irrigation 0.9 % 500 mL irrigation  Status:  Discontinued      Irrigation  Once 07/22/15 0728 07/22/15 1259   07/22/15 0624  ceFAZolin (ANCEF) 2-3 GM-% IVPB SOLR    Comments:  Harvell, Gwendolyn  : cabinet override      07/22/15 0624 07/22/15 1829   07/22/15 0604  gentamicin (GARAMYCIN) 280 mg in dextrose 5 % 100 mL IVPB     5 mg/kg  56.9 kg (Adjusted) 107 mL/hr over 60 Minutes Intravenous Every 24 hours 07/22/15 0604     07/22/15 0600  ceFAZolin (ANCEF) IVPB 2 g/50 mL premix     2 g 100 mL/hr over 30 Minutes Intravenous On call to O.R. 07/21/15 1351 07/22/15 0741    .  They were given sequential compression devices,  early ambulation, and chemoprophylaxis for DVT prophylaxis. Postoperative course was normal.  Patient remained afebrile, tolerated po well. Her catheter was removed on POD#1.    Recent vital signs:  Filed Vitals:   07/23/15 0121 07/23/15 0539  BP: 98/57 111/71  Pulse: 73 74  Temp: 98.3 F (36.8 C) 98.5 F (36.9 C)  Resp: 16 16  Physical Examination: General appearance - alert, well appearing, and in no distress Chest - normal effort Abdomen - soft, appropriately tender Neurological - alert, oriented, normal speech, no focal findings or movement disorder noted Extremities - Homan's sign negative bilaterally   Recent laboratory studies:  Results for orders placed or performed during the hospital encounter of 07/08/15  APTT  Result Value Ref Range   aPTT 28 24 - 37 seconds  Basic metabolic panel  Result Value Ref Range   Sodium 138 135 - 145 mmol/L   Potassium 4.2 3.5 - 5.1 mmol/L   Chloride 103 101 - 111 mmol/L   CO2 28 22 - 32 mmol/L   Glucose, Bld 118 (H) 65 - 99 mg/dL   BUN 13 6 - 20 mg/dL   Creatinine, Ser 0.80 0.44 - 1.00 mg/dL   Calcium 9.7 8.9 - 10.3 mg/dL   GFR calc non Af Amer >60 >60 mL/min   GFR calc Af Amer >60 >60 mL/min   Anion gap 7 5 - 15  CBC  Result Value  Ref Range   WBC 7.2 4.0 - 10.5 K/uL   RBC 4.35 3.87 - 5.11 MIL/uL   Hemoglobin 13.8 12.0 - 15.0 g/dL   HCT 40.9 36.0 - 46.0 %   MCV 94.0 78.0 - 100.0 fL   MCH 31.7 26.0 - 34.0 pg   MCHC 33.7 30.0 - 36.0 g/dL   RDW 12.4 11.5 - 15.5 %   Platelets 250 150 - 400 K/uL  Protime-INR  Result Value Ref Range   Prothrombin Time 14.1 11.6 - 15.2 seconds   INR 1.07 0.00 - 1.49  Type and screen  Result Value Ref Range   ABO/RH(D) O NEG    Antibody Screen NEG    Sample Expiration 07/22/2015    Extend sample reason NO TRANSFUSIONS OR PREGNANCY IN THE PAST 3 MONTHS   ABO/Rh  Result Value Ref Range   ABO/RH(D) O NEG     Discharge Medications:     Medication List    TAKE these medications         CENTRUM ADULTS PO  Take 1 tablet by mouth daily.     cholecalciferol 1000 units tablet  Commonly known as:  VITAMIN D  Take 1,000 Units by mouth daily.     GLUCOSAMINE PO  Take by mouth.     ibuprofen 600 MG tablet  Commonly known as:  ADVIL,MOTRIN  Take 1 tablet (600 mg total) by mouth every 6 (six) hours as needed (mild pain).     oxyCODONE-acetaminophen 5-325 MG tablet  Commonly known as:  PERCOCET/ROXICET  Take 1-2 tablets by mouth every 6 (six) hours as needed.        Disposition: 01-Home or Self Care      Discharge Instructions    Call MD for:  persistant nausea and vomiting    Complete by:  As directed      Call MD for:  redness, tenderness, or signs of infection (pain, swelling, redness, odor or green/yellow discharge around incision site)    Complete by:  As directed      Call MD for:  severe uncontrolled pain    Complete by:  As directed      Call MD for:  temperature >100.4    Complete by:  As directed      Diet - low sodium heart healthy    Complete by:  As directed      Increase activity slowly    Complete by:  As directed      Lifting restrictions    Complete by:  As directed   Nothing > 20 lbs x 4 wks     No wound care    Complete by:  As directed      Sexual Activity Restrictions    Complete by:  As directed   None x 4-6 wks           Follow-up Information    Follow up with [facility] In 3 weeks.   Why:  postop check, call office for appointment   Contact information:   [place] [phone]      Follow up with [facility] In 3 weeks.   Specialty:  Obstetrics and Gynecology   Why:  postop check, they will call you with an appointment   Contact information:   [place] [phone]       Signed: Donnamae Jude 07/23/2015, 9:50 AM

## 2015-07-23 NOTE — Progress Notes (Signed)
1 Day Post-Op   Subjective: Patient reports no significant pain. Passing flatus. No N/V. Vaginal packing removed this morning.   Objective: Vital signs in last 24 hours: Temp:  [97.6 F (36.4 C)-98.5 F (36.9 C)] 98.5 F (36.9 C) (12/28 0539) Pulse Rate:  [72-98] 74 (12/28 0539) Resp:  [15-18] 16 (12/28 0539) BP: (98-138)/(56-80) 111/71 mmHg (12/28 0539) SpO2:  [97 %-100 %] 100 % (12/28 0539) Weight:  [60.328 kg (133 lb)] 60.328 kg (133 lb) (12/27 1422)  Intake/Output from previous day: 12/27 0701 - 12/28 0700 In: 4621.7 [P.O.:960; I.V.:3661.7] Out: 1725 [Urine:1650; Blood:75] Intake/Output this shift: Total I/O In: -  Out: 100 [Urine:100]  Physical Exam:  General:alert, cooperative and appears stated age GI: soft, non tender, normal bowel sounds, no palpable masses, no organomegaly, no inguinal hernia Female genitalia: foley catheter in place with clear yellow urine. Vaginal packing out. Extremities: extremities normal, atraumatic, no cyanosis or edema  Lab Results: No results for input(s): HGB, HCT in the last 72 hours. BMET No results for input(s): NA, K, CL, CO2, GLUCOSE, BUN, CREATININE, CALCIUM in the last 72 hours. No results for input(s): LABPT, INR in the last 72 hours. No results for input(s): LABURIN in the last 72 hours. No results found for this or any previous visit.  Studies/Results: No results found.  Assessment/Plan: 1 Day Post-Op Procedure(s) (LRB): HYSTERECTOMY VAGINAL (N/A) CYSTO (N/A) ANTERIOR (CYSTOCELE) AND POSTERIOR REPAIR (RECTOCELE) (N/A)  POD#1 cystocele and rectocele repair by urology and vaginal hysterectomy by GYN. Recovering well.   Removed vaginal packing  Remove foley catheter  Perform TOV  Discharge instructions discussed with patient   LOS: 1 day   Acie Fredrickson 07/23/2015, 8:22 AM

## 2015-07-23 NOTE — Progress Notes (Signed)
Vaginal packing removed as ordered. Small amount of serosanguineous drainage noted. Patient tolerated well.

## 2015-07-23 NOTE — Progress Notes (Signed)
Looked great yesterday No leg pain/ambulation started  Today: feels great/vital normal/nurse report excellent See orders Instructions detailed

## 2015-07-23 NOTE — Anesthesia Postprocedure Evaluation (Signed)
Anesthesia Post Note  Patient: Chloe Ali  Procedure(s) Performed: Procedure(s) (LRB): HYSTERECTOMY VAGINAL (N/A) CYSTO (N/A) ANTERIOR (CYSTOCELE) AND POSTERIOR REPAIR (RECTOCELE) (N/A)  Patient location during evaluation: [facility] Anesthesia Type: General Level of consciousness: awake and alert and oriented Pain management: pain level controlled Vital Signs Assessment: post-procedure vital signs reviewed and stable Respiratory status: spontaneous breathing and respiratory function stable Cardiovascular status: blood pressure returned to baseline Postop Assessment: adequate PO intake and no signs of nausea or vomiting Anesthetic complications: no    Last Vitals:  Filed Vitals:   07/23/15 0121 07/23/15 0539  BP: 98/57 111/71  Pulse: 73 74  Temp: 36.8 C 36.9 C  Resp: 16 16    Last Pain:  Filed Vitals:   07/23/15 0620  PainSc: 2                  Kennya Schwenn

## 2015-07-23 NOTE — Progress Notes (Signed)
Pt is discharged in the care of daughter per wheelchair escort. Denies any pain or discomfort. Spirits are good. Discharged instructions with Rx were given to pt. Questions asked and answered.Home  Care instructions were given to pt. And daughter on foley care . Understands all instructions well.Pt to  Call office on Friday for check=up/

## 2015-08-05 ENCOUNTER — Encounter: Payer: Self-pay | Admitting: Family Medicine

## 2015-08-05 ENCOUNTER — Ambulatory Visit: Payer: Medicare Other | Admitting: Family Medicine

## 2015-08-05 VITALS — BP 121/73 | HR 65 | Resp 18 | Wt 134.0 lb

## 2015-08-05 DIAGNOSIS — Z09 Encounter for follow-up examination after completed treatment for conditions other than malignant neoplasm: Secondary | ICD-10-CM

## 2015-08-05 NOTE — Progress Notes (Signed)
   Subjective:    Patient ID: Chloe Ali is a 70 y.o. female presenting with Routine Post Op  on 08/05/2015  HPI: Patient reports feeling well. She does not need any pain meds.  Having some vaginal discharge.  Would like to begin driving.  Review of Systems  Constitutional: Negative for fever and chills.  Respiratory: Negative for shortness of breath.   Cardiovascular: Negative for chest pain.  Gastrointestinal: Negative for nausea, vomiting and abdominal pain.  Genitourinary: Negative for dysuria.  Skin: Negative for rash.      Objective:    BP 121/73 mmHg  Pulse 65  Resp 18  Wt 134 lb (60.782 kg) Physical Exam  Constitutional: She is oriented to person, place, and time. She appears well-developed and well-nourished. No distress.  HENT:  Head: Normocephalic and atraumatic.  Eyes: No scleral icterus.  Neck: Neck supple.  Cardiovascular: Normal rate.   Pulmonary/Chest: Effort normal.  Abdominal: Soft.  Genitourinary:  There are sutures and healing tissue, at apex, anteriorly and posteriorly. Yellow vaginal discharge, associated with surgical healing is noted.  There is no mass at the cuff and no vaginal tenderness.  Neurological: She is alert and oriented to person, place, and time.  Skin: Skin is warm and dry.  Psychiatric: She has a normal mood and affect.        Assessment & Plan:  Postop check - Doing well--incisions are healing well.  Great support anteriorly. May slowly increase activity over next few weeks.  She will return to Dr. Matilde Sprang in 4 wks for final postop check.  Chloe Ali S 08/05/2015 11:40 AM

## 2015-08-05 NOTE — Progress Notes (Signed)
Pt here for post-op check, no pain related to the surgery, pt is experiencing a sharp sudden pain shooting down her right leg.

## 2015-08-26 ENCOUNTER — Telehealth: Payer: Self-pay | Admitting: Family Medicine

## 2015-08-26 DIAGNOSIS — E7800 Pure hypercholesterolemia, unspecified: Secondary | ICD-10-CM

## 2015-08-26 DIAGNOSIS — Z0000 Encounter for general adult medical examination without abnormal findings: Secondary | ICD-10-CM | POA: Insufficient documentation

## 2015-08-26 NOTE — Telephone Encounter (Signed)
-----   Message from Marchia Bond sent at 08/19/2015  9:30 AM EST ----- Regarding:  Cpx labs Wed 2/1, need orders. Thanks! :-) Please order  future cpx labs for pt's upcoming lab appt. Thanks Aniceto Boss

## 2015-08-27 ENCOUNTER — Other Ambulatory Visit (INDEPENDENT_AMBULATORY_CARE_PROVIDER_SITE_OTHER): Payer: Medicare Other

## 2015-08-27 DIAGNOSIS — Z0000 Encounter for general adult medical examination without abnormal findings: Secondary | ICD-10-CM | POA: Diagnosis not present

## 2015-08-27 DIAGNOSIS — E7800 Pure hypercholesterolemia, unspecified: Secondary | ICD-10-CM

## 2015-08-27 LAB — COMPREHENSIVE METABOLIC PANEL
ALBUMIN: 4.4 g/dL (ref 3.5–5.2)
ALK PHOS: 82 U/L (ref 39–117)
ALT: 15 U/L (ref 0–35)
AST: 17 U/L (ref 0–37)
BUN: 13 mg/dL (ref 6–23)
CHLORIDE: 102 meq/L (ref 96–112)
CO2: 29 mEq/L (ref 19–32)
Calcium: 10.1 mg/dL (ref 8.4–10.5)
Creatinine, Ser: 0.81 mg/dL (ref 0.40–1.20)
GFR: 74.48 mL/min (ref >60.00)
GLUCOSE: 94 mg/dL (ref 70–99)
POTASSIUM: 4.4 meq/L (ref 3.5–5.1)
SODIUM: 139 meq/L (ref 135–145)
TOTAL PROTEIN: 7.4 g/dL (ref 6.0–8.3)
Total Bilirubin: 0.8 mg/dL (ref 0.2–1.2)

## 2015-08-27 LAB — CBC WITH DIFFERENTIAL/PLATELET
BASOS PCT: 0.8 % (ref 0.0–3.0)
Basophils Absolute: 0 10*3/uL (ref 0.0–0.1)
EOS PCT: 3.6 % (ref 0.0–5.0)
Eosinophils Absolute: 0.2 10*3/uL (ref 0.0–0.7)
HCT: 41.4 % (ref 36.0–46.0)
Hemoglobin: 13.9 g/dL (ref 12.0–15.0)
LYMPHS ABS: 2.7 10*3/uL (ref 0.7–4.0)
Lymphocytes Relative: 42.6 % (ref 12.0–46.0)
MCHC: 33.5 g/dL (ref 30.0–36.0)
MCV: 95.9 fl (ref 78.0–100.0)
MONO ABS: 0.4 10*3/uL (ref 0.1–1.0)
MONOS PCT: 6.9 % (ref 3.0–12.0)
NEUTROS PCT: 46.1 % (ref 43.0–77.0)
Neutro Abs: 2.9 10*3/uL (ref 1.4–7.7)
Platelets: 265 10*3/uL (ref 150.0–400.0)
RBC: 4.32 Mil/uL (ref 3.87–5.11)
RDW: 13.8 % (ref 11.5–15.5)
WBC: 6.3 10*3/uL (ref 4.0–10.5)

## 2015-08-27 LAB — LIPID PANEL
CHOLESTEROL: 244 mg/dL — AB (ref 0–200)
HDL: 51.4 mg/dL (ref >39.00)
NonHDL: 192.66
Total CHOL/HDL Ratio: 5
Triglycerides: 273 mg/dL — ABNORMAL HIGH (ref 0.0–149.0)
VLDL: 54.6 mg/dL — AB (ref 0.0–40.0)

## 2015-08-27 LAB — LDL CHOLESTEROL, DIRECT: LDL DIRECT: 143 mg/dL

## 2015-08-27 LAB — TSH: TSH: 1.86 u[IU]/mL (ref 0.35–4.50)

## 2015-08-31 NOTE — Telephone Encounter (Signed)
error 

## 2015-09-03 ENCOUNTER — Ambulatory Visit (INDEPENDENT_AMBULATORY_CARE_PROVIDER_SITE_OTHER): Payer: Medicare Other

## 2015-09-03 ENCOUNTER — Encounter: Payer: Self-pay | Admitting: Family Medicine

## 2015-09-03 ENCOUNTER — Ambulatory Visit (INDEPENDENT_AMBULATORY_CARE_PROVIDER_SITE_OTHER): Payer: Medicare Other | Admitting: Family Medicine

## 2015-09-03 VITALS — BP 118/70 | HR 80 | Temp 97.9°F | Ht 64.75 in | Wt 130.2 lb

## 2015-09-03 DIAGNOSIS — M8580 Other specified disorders of bone density and structure, unspecified site: Secondary | ICD-10-CM | POA: Diagnosis not present

## 2015-09-03 DIAGNOSIS — E7800 Pure hypercholesterolemia, unspecified: Secondary | ICD-10-CM

## 2015-09-03 DIAGNOSIS — Z23 Encounter for immunization: Secondary | ICD-10-CM | POA: Diagnosis not present

## 2015-09-03 DIAGNOSIS — Z0000 Encounter for general adult medical examination without abnormal findings: Secondary | ICD-10-CM

## 2015-09-03 NOTE — Patient Instructions (Addendum)
Continue to exercise as tolerated and weather permits.  Continue to eat low fat, low cholesterol diet.  Continue to drink at least 64 oz water daily.   Please contact PCP if a mammogram referral is needed.    PATIENT SMART GOAL: Starting 09/03/15, I will exercise for 1 hr 3 days/week.

## 2015-09-03 NOTE — Progress Notes (Signed)
Pre-visit discussion using our clinic review tool. No additional management support is needed unless otherwise documented below in the visit note.  

## 2015-09-03 NOTE — Progress Notes (Signed)
Subjective:    Patient ID: Chloe Ali, female    DOB: [birth year], 70 y.o.   MRN: [id number]  HPI Here for health maintenance exam and to review chronic medical problems    Wt is down 4 lb with bmi of 21 Doing well with diet   Hep C screening - declines/not high risk and also gives blood   Zoster vaccine - insurance does not cover it enough to afford/still has to pay 80$  Flu shot - wants to get today  Mm neg 11/16  Self breast exam -no lumps or changes   colonosc utd until 2/19  Td 6/13  PNA vaccines -complete  dexa- openia 1/16 No falls or fractures  Never broken a bone  Taking her ca and D  Does exercise -walks up to 8 mi per day (before surg)- chair aerobics and tai chi   Some watery eyes and cough and pnd  No facial pain or fever   Had a cystocele and rectocele repair and hysterectomy Doing great    Cholesterol Lab Results  Component Value Date   CHOL 244* 08/27/2015   CHOL 255* 06/17/2014   CHOL 236* 01/02/2013   Lab Results  Component Value Date   HDL 51.40 08/27/2015   HDL 54.60 06/17/2014   HDL 48.90 01/02/2013   Lab Results  Component Value Date   LDLCALC 165* 06/17/2014   LDLCALC 118* 10/04/2007   Lab Results  Component Value Date   TRIG 273.0* 08/27/2015   TRIG 177.0* 06/17/2014   TRIG 173.0* 01/02/2013   Lab Results  Component Value Date   CHOLHDL 5 08/27/2015   CHOLHDL 5 06/17/2014   CHOLHDL 5 01/02/2013   Lab Results  Component Value Date   LDLDIRECT 143.0 08/27/2015   LDLDIRECT 149.7 01/02/2013   LDLDIRECT 140.7 12/21/2011   doing excellent with diet and exercise  Eats low sat fat diet  This is genetic LDL is down from 165 to 143  HDL is great  She does take glucosamine   Results for orders placed or performed in visit on 08/27/15  CBC with Differential/Platelet  Result Value Ref Range   WBC 6.3 4.0 - 10.5 K/uL   RBC 4.32 3.87 - 5.11 Mil/uL   Hemoglobin 13.9 12.0 - 15.0 g/dL   HCT 41.4 36.0 - 46.0 %   MCV  95.9 78.0 - 100.0 fl   MCHC 33.5 30.0 - 36.0 g/dL   RDW 13.8 11.5 - 15.5 %   Platelets 265.0 150.0 - 400.0 K/uL   Neutrophils Relative % 46.1 43.0 - 77.0 %   Lymphocytes Relative 42.6 12.0 - 46.0 %   Monocytes Relative 6.9 3.0 - 12.0 %   Eosinophils Relative 3.6 0.0 - 5.0 %   Basophils Relative 0.8 0.0 - 3.0 %   Neutro Abs 2.9 1.4 - 7.7 K/uL   Lymphs Abs 2.7 0.7 - 4.0 K/uL   Monocytes Absolute 0.4 0.1 - 1.0 K/uL   Eosinophils Absolute 0.2 0.0 - 0.7 K/uL   Basophils Absolute 0.0 0.0 - 0.1 K/uL  Comprehensive metabolic panel  Result Value Ref Range   Sodium 139 135 - 145 mEq/L   Potassium 4.4 3.5 - 5.1 mEq/L   Chloride 102 96 - 112 mEq/L   CO2 29 19 - 32 mEq/L   Glucose, Bld 94 70 - 99 mg/dL   BUN 13 6 - 23 mg/dL   Creatinine, Ser 0.81 0.40 - 1.20 mg/dL   Total Bilirubin 0.8 0.2 - 1.2 mg/dL  Alkaline Phosphatase 82 39 - 117 U/L   AST 17 0 - 37 U/L   ALT 15 0 - 35 U/L   Total Protein 7.4 6.0 - 8.3 g/dL   Albumin 4.4 3.5 - 5.2 g/dL   Calcium 10.1 8.4 - 10.5 mg/dL   GFR 74.48 >60.00 mL/min  Lipid panel  Result Value Ref Range   Cholesterol 244 (H) 0 - 200 mg/dL   Triglycerides 273.0 (H) 0.0 - 149.0 mg/dL   HDL 51.40 >39.00 mg/dL   VLDL 54.6 (H) 0.0 - 40.0 mg/dL   Total CHOL/HDL Ratio 5    NonHDL 192.66   TSH  Result Value Ref Range   TSH 1.86 0.35 - 4.50 uIU/mL  LDL cholesterol, direct  Result Value Ref Range   Direct LDL 143.0 mg/dL    Patient Active Problem List   Diagnosis Date Noted  . Routine general medical examination at a health care facility 08/26/2015  . Osteopenia 08/16/2014  . Estrogen deficiency 06/24/2014  . Irritable bowel syndrome 09/04/2012  . Eczema 12/28/2011  . ALLERGIC RHINITIS CAUSE UNSPECIFIED 03/26/2010  . HYPERCHOLESTEROLEMIA 08/23/2007   Past Medical History  Diagnosis Date  . History of chicken pox   . Hay fever   . HLD (hyperlipidemia)    Past Surgical History  Procedure Laterality Date  . Tubal ligation  1976  . Vaginal  hysterectomy N/A 07/22/2015    Procedure: HYSTERECTOMY VAGINAL;  Surgeon: Donnamae Jude, MD;  Location: [facility];  Service: Gynecology;  Laterality: N/A;  Requested 07/22/15 @ 7:30a with Dr. Rogue Bussing to follow  . Cysto N/A 07/22/2015    Procedure: CYSTO;  Surgeon: Bjorn Loser, MD;  Location: [facility];  Service: Urology;  Laterality: N/A;  . Anterior and posterior repair N/A 07/22/2015    Procedure: ANTERIOR (CYSTOCELE) AND POSTERIOR REPAIR (RECTOCELE);  Surgeon: Bjorn Loser, MD;  Location: [facility];  Service: Urology;  Laterality: N/A;   Social History  Substance Use Topics  . Smoking status: Never Smoker   . Smokeless tobacco: None  . Alcohol Use: 0.0 oz/week    0 Standard drinks or equivalent per week     Comment: Social   Family History  Problem Relation Age of Onset  . Breast cancer Sister   . Cancer Sister 50    breast  . Pancreatic cancer Mother   . Diabetes Mother   . Cancer Mother     pancreatic  . Lung cancer Father   . Cancer Father     lung  . Stroke Maternal Grandfather    No Known Allergies Current Outpatient Prescriptions on File Prior to Visit  Medication Sig Dispense Refill  . cholecalciferol (VITAMIN D) 1000 UNITS tablet Take 1,000 Units by mouth daily.    Marland Kitchen ibuprofen (ADVIL,MOTRIN) 600 MG tablet Take 1 tablet (600 mg total) by mouth every 6 (six) hours as needed (mild pain). 30 tablet 0  . Multiple Vitamins-Minerals (CENTRUM ADULTS PO) Take 1 tablet by mouth daily.     No current facility-administered medications on file prior to visit.    Review of Systems Review of Systems  Constitutional: Negative for fever, appetite change, fatigue and unexpected weight change.  Eyes: Negative for pain and visual disturbance.  Respiratory: Negative for cough and shortness of breath.   Cardiovascular: Negative for cp or palpitations    Gastrointestinal: Negative for nausea, diarrhea and constipation.  Genitourinary: Negative for urgency and frequency.  Skin:  Negative for pallor or rash   Neurological: Negative for weakness, light-headedness,  numbness and headaches.  Hematological: Negative for adenopathy. Does not bruise/bleed easily.  Psychiatric/Behavioral: Negative for dysphoric mood. The patient is not nervous/anxious.         Objective:   Physical Exam  Constitutional: She appears well-developed and well-nourished. No distress.  Well appearing   HENT:  Head: Normocephalic and atraumatic.  Right Ear: External ear normal.  Left Ear: External ear normal.  Mouth/Throat: Oropharynx is clear and moist.  Eyes: Conjunctivae and EOM are normal. Pupils are equal, round, and reactive to light. No scleral icterus.  Neck: Normal range of motion. Neck supple. No JVD present. Carotid bruit is not present. No thyromegaly present.  Cardiovascular: Normal rate, regular rhythm, normal heart sounds and intact distal pulses.  Exam reveals no gallop.   Pulmonary/Chest: Effort normal and breath sounds normal. No respiratory distress. She has no wheezes. She exhibits no tenderness.  Abdominal: Soft. Bowel sounds are normal. She exhibits no distension, no abdominal bruit and no mass. There is no tenderness.  Genitourinary: No breast swelling, tenderness, discharge or bleeding.  Breast exam: No mass, nodules, thickening, tenderness, bulging, retraction, inflamation, nipple discharge or skin changes noted.  No axillary or clavicular LA.      Musculoskeletal: Normal range of motion. She exhibits no edema or tenderness.  No kyphosis   Lymphadenopathy:    She has no cervical adenopathy.  Neurological: She is alert. She has normal reflexes. No cranial nerve deficit. She exhibits normal muscle tone. Coordination normal.  Skin: Skin is warm and dry. No rash noted. No erythema. No pallor.  Some SKs and solar lentigines   Psychiatric: She has a normal mood and affect.          Assessment & Plan:   Problem List Items Addressed This Visit      Musculoskeletal  and Integument   Osteopenia - Primary    No falls or fractures Great exercise Next dexa 1 year  Disc need for calcium/ vitamin D/ wt bearing exercise and bone density test every 2 y to monitor Disc safety/ fracture risk in detail          Other   HYPERCHOLESTEROLEMIA    Disc goals for lipids and reasons to control them Rev labs with pt Rev low sat fat diet in detail  Given handout-re: low chol foods  Also adv to hold glucosamine-which may increase it  Also consider fish oil for HDL and triglycerides       Routine general medical examination at a health care facility    Reviewed health habits including diet and exercise and skin cancer prevention Reviewed appropriate screening tests for age  Also reviewed health mt list, fam hx and immunization status , as well as social and family history   See HPI Labs reviewed Keep taking good care of yourself  Stop glucosamine  Try fish oil (omega 3) - 1000 to 3000 mg daily  Avoid red meat/ fried foods/ egg yolks/ fatty breakfast meats/ butter, cheese and high fat dairy/ and shellfish         Other Visit Diagnoses    Need for influenza vaccination        Relevant Orders    Flu Vaccine QUAD 36+ mos PF IM (Fluarix & Fluzone Quad PF) (Completed)

## 2015-09-03 NOTE — Patient Instructions (Signed)
Keep taking good care of yourself  Stop glucosamine  Try fish oil (omega 3) - 1000 to 3000 mg daily  Avoid red meat/ fried foods/ egg yolks/ fatty breakfast meats/ butter, cheese and high fat dairy/ and shellfish

## 2015-09-03 NOTE — Progress Notes (Signed)
I have personally reviewed the Medicare Annual Wellness questionnaire and have noted 1. The patient's medical and social history - reviewed and changes made to chart as necessary 2. Their use of alcohol, tobacco or illicit drugs - no evidence of alcohol, tobacco, or drug abuse 3. Their current medications and supplements - reviewed and changes made to chart as needed 4. The patient's functional ability:  ADL's - independent fall risks - none home safety risks - none hearing - passed hearing screen Vision - wears corrective lenses; annual exams with Dr. Katy Fitch  5. Cognitive function has been assessed and addressed as indicated - passed Mini-Cog screen  6. Diet - low fat, low sat diet   7. Physical activity - recently released from restrictions; will begin exercise goal of 1 hr daily for 3 days per week  8. Evidence for depression or mood disorders - negative depression screening  The patients weight, height, BMI have been recorded in the chart.  I have made referrals, counseling and provided education to the patient based on review of the above and I have provided the pt with a written personalized care plan for preventive services.  Provider list updated. - denies seeing any other providers/specialists  Reviewed preventative protocols and updated unless pt declined.  Preventative: Colon cancer screening - due 09/19/2017 Prostate cancer screening - N/A Lung cancer screening - N/A Breast cancer screening - discussed; will schedule Well woman exam -  DEXA scan - completed 08/13/2014 Flu shot - postponed at this time Tetanus shot - due 12/27/2021 Pneumonia shot -  Completed 06/24/14 Shingles shot - barrier to compliance is cost Advanced directive discussion - will complete paperwork Hepatitis C screening - declined  See scanned questionairre as needed for further documentation.

## 2015-09-03 NOTE — Progress Notes (Signed)
Pre visit review using our clinic review tool, if applicable. No additional management support is needed unless otherwise documented below in the visit note. 

## 2015-09-04 NOTE — Progress Notes (Signed)
   Subjective:    Patient ID: Chloe Ali, female    DOB: [birth year], 70 y.o.   MRN: [id number]  HPI    Review of Systems     Objective:   Physical Exam        Assessment & Plan:  Note reviewed and agree with plan

## 2015-09-04 NOTE — Assessment & Plan Note (Signed)
Disc goals for lipids and reasons to control them Rev labs with pt Rev low sat fat diet in detail  Given handout-re: low chol foods  Also adv to hold glucosamine-which may increase it  Also consider fish oil for HDL and triglycerides

## 2015-09-04 NOTE — Assessment & Plan Note (Signed)
No falls or fractures Great exercise Next dexa 1 year  Disc need for calcium/ vitamin D/ wt bearing exercise and bone density test every 2 y to monitor Disc safety/ fracture risk in detail

## 2015-09-04 NOTE — Assessment & Plan Note (Signed)
Reviewed health habits including diet and exercise and skin cancer prevention Reviewed appropriate screening tests for age  Also reviewed health mt list, fam hx and immunization status , as well as social and family history   See HPI Labs reviewed Keep taking good care of yourself  Stop glucosamine  Try fish oil (omega 3) - 1000 to 3000 mg daily  Avoid red meat/ fried foods/ egg yolks/ fatty breakfast meats/ butter, cheese and high fat dairy/ and shellfish

## 2015-10-13 DIAGNOSIS — L578 Other skin changes due to chronic exposure to nonionizing radiation: Secondary | ICD-10-CM | POA: Diagnosis not present

## 2015-10-13 DIAGNOSIS — L570 Actinic keratosis: Secondary | ICD-10-CM | POA: Diagnosis not present

## 2015-10-13 DIAGNOSIS — I788 Other diseases of capillaries: Secondary | ICD-10-CM | POA: Diagnosis not present

## 2015-10-21 DIAGNOSIS — H35373 Puckering of macula, bilateral: Secondary | ICD-10-CM | POA: Diagnosis not present

## 2015-10-21 DIAGNOSIS — H353111 Nonexudative age-related macular degeneration, right eye, early dry stage: Secondary | ICD-10-CM | POA: Diagnosis not present

## 2015-10-21 DIAGNOSIS — H04123 Dry eye syndrome of bilateral lacrimal glands: Secondary | ICD-10-CM | POA: Diagnosis not present

## 2015-10-21 DIAGNOSIS — H2513 Age-related nuclear cataract, bilateral: Secondary | ICD-10-CM | POA: Diagnosis not present

## 2015-10-28 DIAGNOSIS — N8110 Cystocele, unspecified: Secondary | ICD-10-CM | POA: Diagnosis not present

## 2015-10-29 ENCOUNTER — Encounter: Payer: Self-pay | Admitting: *Deleted

## 2015-11-21 ENCOUNTER — Encounter: Payer: Self-pay | Admitting: Family Medicine

## 2015-11-21 ENCOUNTER — Ambulatory Visit (INDEPENDENT_AMBULATORY_CARE_PROVIDER_SITE_OTHER): Payer: Medicare Other | Admitting: Family Medicine

## 2015-11-21 VITALS — BP 126/69 | HR 74 | Resp 16 | Ht 64.0 in | Wt 131.0 lb

## 2015-11-21 DIAGNOSIS — E2839 Other primary ovarian failure: Secondary | ICD-10-CM

## 2015-11-21 NOTE — Progress Notes (Signed)
Patient ID: Chloe Ali, female   DOB: [birth year], 70 y.o.   MRN: [id number] Pt here for follow up for vaginal cuff bleeding. Dr. Matilde Sprang used silver nitrate per his office notes. Pt states she has had no bleeding since then. She also states she did not start the Estrace cream MacDiarmid perscribed due to side effects she read about.

## 2015-11-21 NOTE — Progress Notes (Signed)
    Subjective:    Patient ID: Chloe Ali is a 70 y.o. female presenting with Follow-up  on 11/21/2015  HPI: At end of march had bleeding and seen by Dr. Matilde Sprang and treated with AgNO3. Placed on estrogen cream and she decided not to use it due to side effects she read about. Still has some hot flashes. No more bulge in her vagina and has resumed her normal ADL's including exercise.  Review of Systems  Constitutional: Negative for fever and chills.  Respiratory: Negative for shortness of breath.   Cardiovascular: Negative for chest pain.  Gastrointestinal: Negative for nausea, vomiting and abdominal pain.  Genitourinary: Negative for dysuria.  Skin: Negative for rash.      Objective:    BP 126/69 mmHg  Pulse 74  Resp 16  Ht 5\' 4"  (1.626 m)  Wt 131 lb (59.421 kg)  BMI 22.47 kg/m2 Physical Exam  Constitutional: She is oriented to person, place, and time. She appears well-developed and well-nourished. No distress.  HENT:  Head: Normocephalic and atraumatic.  Eyes: No scleral icterus.  Neck: Neck supple.  Cardiovascular: Normal rate.   Pulmonary/Chest: Effort normal.  Abdominal: Soft.  Neurological: She is alert and oriented to person, place, and time.  Skin: Skin is warm and dry.  Psychiatric: She has a normal mood and affect.      Assessment & Plan:   Problem List Items Addressed This Visit      Unprioritized   Estrogen deficiency - Primary    ? Is bleeding from vagina was related to this. Seems to no longer be an issue--ok not to use estrogen cream. KY or water based lubricant for intercourse.         Total face-to-face time with patient: 15 minutes. Over 50% of encounter was spent on counseling and coordination of care.  Return in about 1 year (around 11/20/2016), or if symptoms worsen or fail to improve.  Jamillah Camilo S 11/21/2015 8:52 AM

## 2015-11-21 NOTE — Assessment & Plan Note (Signed)
?   Is bleeding from vagina was related to this. Seems to no longer be an issue--ok not to use estrogen cream. KY or water based lubricant for intercourse.

## 2015-12-26 ENCOUNTER — Other Ambulatory Visit: Payer: Self-pay | Admitting: Family Medicine

## 2015-12-26 DIAGNOSIS — Z1231 Encounter for screening mammogram for malignant neoplasm of breast: Secondary | ICD-10-CM

## 2016-01-02 ENCOUNTER — Ambulatory Visit: Payer: Medicare Other

## 2016-01-16 ENCOUNTER — Ambulatory Visit
Admission: RE | Admit: 2016-01-16 | Discharge: 2016-01-16 | Disposition: A | Discharge status: Home / Self Care | Payer: Medicare Other | Source: Ambulatory Visit | Attending: Family Medicine | Admitting: Family Medicine

## 2016-01-16 ENCOUNTER — Other Ambulatory Visit: Payer: Self-pay | Admitting: Family Medicine

## 2016-01-16 DIAGNOSIS — Z1231 Encounter for screening mammogram for malignant neoplasm of breast: Secondary | ICD-10-CM

## 2016-01-16 LAB — HM MAMMOGRAPHY

## 2016-01-19 ENCOUNTER — Encounter: Payer: Self-pay | Admitting: *Deleted

## 2016-06-10 ENCOUNTER — Ambulatory Visit (INDEPENDENT_AMBULATORY_CARE_PROVIDER_SITE_OTHER): Payer: Medicare Other

## 2016-06-10 DIAGNOSIS — Z23 Encounter for immunization: Secondary | ICD-10-CM | POA: Diagnosis not present

## 2016-09-02 ENCOUNTER — Telehealth: Payer: Self-pay | Admitting: Family Medicine

## 2016-09-02 DIAGNOSIS — Z0000 Encounter for general adult medical examination without abnormal findings: Secondary | ICD-10-CM

## 2016-09-02 NOTE — Telephone Encounter (Signed)
-----   Message from Eustace Pen, LPN sent at [phone]  4:35 PM EST ----- Regarding: Labs 2/9 Please place lab orders. Thank you.

## 2016-09-03 ENCOUNTER — Encounter (INDEPENDENT_AMBULATORY_CARE_PROVIDER_SITE_OTHER): Payer: Self-pay

## 2016-09-03 ENCOUNTER — Ambulatory Visit (INDEPENDENT_AMBULATORY_CARE_PROVIDER_SITE_OTHER): Payer: Medicare Other

## 2016-09-03 VITALS — BP 120/80 | HR 68 | Temp 97.8°F | Ht 63.5 in | Wt 135.5 lb

## 2016-09-03 DIAGNOSIS — Z0000 Encounter for general adult medical examination without abnormal findings: Secondary | ICD-10-CM | POA: Diagnosis not present

## 2016-09-03 DIAGNOSIS — Z1159 Encounter for screening for other viral diseases: Secondary | ICD-10-CM

## 2016-09-03 LAB — COMPREHENSIVE METABOLIC PANEL
ALT: 18 U/L (ref 0–35)
AST: 20 U/L (ref 0–37)
Albumin: 4.7 g/dL (ref 3.5–5.2)
Alkaline Phosphatase: 79 U/L (ref 39–117)
BUN: 13 mg/dL (ref 6–23)
CALCIUM: 10.3 mg/dL (ref 8.4–10.5)
CHLORIDE: 100 meq/L (ref 96–112)
CO2: 29 meq/L (ref 19–32)
Creatinine, Ser: 0.79 mg/dL (ref 0.40–1.20)
GFR: 76.43 mL/min (ref >60.00)
Glucose, Bld: 91 mg/dL (ref 70–99)
POTASSIUM: 4.6 meq/L (ref 3.5–5.1)
Sodium: 135 mEq/L (ref 135–145)
Total Bilirubin: 0.7 mg/dL (ref 0.2–1.2)
Total Protein: 7.5 g/dL (ref 6.0–8.3)

## 2016-09-03 LAB — CBC WITH DIFFERENTIAL/PLATELET
BASOS PCT: 0.5 % (ref 0.0–3.0)
Basophils Absolute: 0 10*3/uL (ref 0.0–0.1)
EOS ABS: 0.1 10*3/uL (ref 0.0–0.7)
EOS PCT: 1.6 % (ref 0.0–5.0)
HCT: 41.6 % (ref 36.0–46.0)
Hemoglobin: 14.2 g/dL (ref 12.0–15.0)
LYMPHS PCT: 41.8 % (ref 12.0–46.0)
Lymphs Abs: 2.5 10*3/uL (ref 0.7–4.0)
MCHC: 34.1 g/dL (ref 30.0–36.0)
MCV: 94.5 fl (ref 78.0–100.0)
MONOS PCT: 8.1 % (ref 3.0–12.0)
Monocytes Absolute: 0.5 10*3/uL (ref 0.1–1.0)
NEUTROS ABS: 2.9 10*3/uL (ref 1.4–7.7)
Neutrophils Relative %: 48 % (ref 43.0–77.0)
PLATELETS: 286 10*3/uL (ref 150.0–400.0)
RBC: 4.4 Mil/uL (ref 3.87–5.11)
RDW: 12.9 % (ref 11.5–15.5)
WBC: 6 10*3/uL (ref 4.0–10.5)

## 2016-09-03 LAB — TSH: TSH: 2.45 u[IU]/mL (ref 0.35–4.50)

## 2016-09-03 LAB — LIPID PANEL
CHOL/HDL RATIO: 5
CHOLESTEROL: 273 mg/dL — AB (ref 0–200)
HDL: 55.6 mg/dL (ref >39.00)
LDL CALC: 178 mg/dL — AB (ref 0–99)
NonHDL: 217.18
TRIGLYCERIDES: 196 mg/dL — AB (ref 0.0–149.0)
VLDL: 39.2 mg/dL (ref 0.0–40.0)

## 2016-09-03 NOTE — Patient Instructions (Signed)
Chloe Ali , Thank you for taking time to come for your Medicare Wellness Visit. I appreciate your ongoing commitment to your health goals. Please review the following plan we discussed and let me know if I can assist you in the future.   These are the goals we discussed: Goals    . Increase physical activity          SMART goal: Starting 09/03/15, patient will exercise for 1 hr 3 days per week as tolerated and weather permits.     . Increase physical activity          Starting 09/03/2016, I will continue to walk at least 5-6 miles daily as weather permits.        This is a list of the screening recommended for you and due dates:  Health Maintenance  Topic Date Due  . Shingles Vaccine  08/27/2020*  . Mammogram  01/15/2017  . Colon Cancer Screening  09/19/2017  . Tetanus Vaccine  12/27/2021  . Flu Shot  Completed  . DEXA scan (bone density measurement)  Completed  .  Hepatitis C: One time screening is recommended by Center for Disease Control  (CDC) for  adults born from [birth year] through 1965.   Completed  . Pneumonia vaccines  Completed  *Topic was postponed. The date shown is not the original due date.   Preventive Care for Adults  A healthy lifestyle and preventive care can promote health and wellness. Preventive health guidelines for adults include the following key practices.  . A routine yearly physical is a good way to check with your health care provider about your health and preventive screening. It is a chance to share any concerns and updates on your health and to receive a thorough exam.  . Visit your dentist for a routine exam and preventive care every 6 months. Brush your teeth twice a day and floss once a day. Good oral hygiene prevents tooth decay and gum disease.  . The frequency of eye exams is based on your age, health, family medical history, use  of contact lenses, and other factors. Follow your health care provider's ecommendations for frequency of eye exams.  .  Eat a healthy diet. Foods like vegetables, fruits, whole grains, low-fat dairy products, and lean protein foods contain the nutrients you need without too many calories. Decrease your intake of foods high in solid fats, added sugars, and salt. Eat the right amount of calories for you. Get information about a proper diet from your health care provider, if necessary.  . Regular physical exercise is one of the most important things you can do for your health. Most adults should get at least 150 minutes of moderate-intensity exercise (any activity that increases your heart rate and causes you to sweat) each week. In addition, most adults need muscle-strengthening exercises on 2 or more days a week.  Silver Sneakers may be a benefit available to you. To determine eligibility, you may visit the website: www.silversneakers.com or contact program at 574-716-1211 Mon-Fri between 8AM-8PM.   . Maintain a healthy weight. The body mass index (BMI) is a screening tool to identify possible weight problems. It provides an estimate of body fat based on height and weight. Your health care provider can find your BMI and can help you achieve or maintain a healthy weight.   For adults 20 years and older: ? A BMI below 18.5 is considered underweight. ? A BMI of 18.5 to 24.9 is normal. ? A BMI of 25  to 29.9 is considered overweight. ? A BMI of 30 and above is considered obese.   . Maintain normal blood lipids and cholesterol levels by exercising and minimizing your intake of saturated fat. Eat a balanced diet with plenty of fruit and vegetables. Blood tests for lipids and cholesterol should begin at age 63 and be repeated every 5 years. If your lipid or cholesterol levels are high, you are over 50, or you are at high risk for heart disease, you may need your cholesterol levels checked more frequently. Ongoing high lipid and cholesterol levels should be treated with medicines if diet and exercise are not working.  . If  you smoke, find out from your health care provider how to quit. If you do not use tobacco, please do not start.  . If you choose to drink alcohol, please do not consume more than 2 drinks per day. One drink is considered to be 12 ounces (355 mL) of beer, 5 ounces (148 mL) of wine, or 1.5 ounces (44 mL) of liquor.  . If you are 83-38 years old, ask your health care provider if you should take aspirin to prevent strokes.  . Use sunscreen. Apply sunscreen liberally and repeatedly throughout the day. You should seek shade when your shadow is shorter than you. Protect yourself by wearing long sleeves, pants, a wide-brimmed hat, and sunglasses year round, whenever you are outdoors.  . Once a month, do a whole body skin exam, using a mirror to look at the skin on your back. Tell your health care provider of new moles, moles that have irregular borders, moles that are larger than a pencil eraser, or moles that have changed in shape or color.

## 2016-09-03 NOTE — Progress Notes (Signed)
Subjective:   Chloe Ali is a 71 y.o. female who presents for Medicare Annual (Subsequent) preventive examination.  Review of Systems:  N/A Cardiac Risk Factors include: advanced age (>60men, >8 women);dyslipidemia     Objective:     Vitals: BP 120/80 (BP Location: Right Arm, Patient Position: Sitting, Cuff Size: Normal)   Pulse 68   Temp 97.8 F (36.6 C) (Oral)   Ht 5' 3.5" (1.613 m) Comment: no shoes  Wt 135 lb 8 oz (61.5 kg)   SpO2 97%   BMI 23.63 kg/m   Body mass index is 23.63 kg/m.   Tobacco History  Smoking Status  . Never Smoker  Smokeless Tobacco  . Never Used     Counseling given: No   Past Medical History:  Diagnosis Date  . Hay fever   . History of chicken pox   . HLD (hyperlipidemia)    Past Surgical History:  Procedure Laterality Date  . ANTERIOR AND POSTERIOR REPAIR N/A 07/22/2015   Procedure: ANTERIOR (CYSTOCELE) AND POSTERIOR REPAIR (RECTOCELE);  Surgeon: Bjorn Loser, MD;  Location: [facility];  Service: Urology;  Laterality: N/A;  . CYSTO N/A 07/22/2015   Procedure: Kathrene Alu;  Surgeon: Bjorn Loser, MD;  Location: [facility];  Service: Urology;  Laterality: N/A;  . TUBAL LIGATION  1976  . VAGINAL HYSTERECTOMY N/A 07/22/2015   Procedure: HYSTERECTOMY VAGINAL;  Surgeon: Donnamae Jude, MD;  Location: [facility];  Service: Gynecology;  Laterality: N/A;  Requested 07/22/15 @ 7:30a with Dr. Rogue Bussing to follow   Family History  Problem Relation Age of Onset  . Pancreatic cancer Mother   . Diabetes Mother   . Cancer Mother     pancreatic  . Lung cancer Father   . Cancer Father     lung  . Stroke Maternal Grandfather   . Breast cancer Sister   . Cancer Sister 20    breast   History  Sexual Activity  . Sexual activity: Not Currently  . Birth control/ protection: Post-menopausal, Surgical    Outpatient Encounter Prescriptions as of 09/03/2016  Medication Sig  . cholecalciferol (VITAMIN D) 1000 UNITS tablet Take 1,000 Units by mouth  daily.  . Multiple Vitamins-Minerals (CENTRUM ADULTS PO) Take 1 tablet by mouth daily.  . Multiple Vitamins-Minerals (OCUVITE PO) Take 1 tablet by mouth daily.  . Omega-3 Fatty Acids (FISH OIL PO) Take 1 capsule by mouth daily.  . [DISCONTINUED] ibuprofen (ADVIL,MOTRIN) 600 MG tablet Take 1 tablet (600 mg total) by mouth every 6 (six) hours as needed (mild pain).   No facility-administered encounter medications on file as of 09/03/2016.     Activities of Daily Living In your present state of health, do you have any difficulty performing the following activities: 09/03/2016  Hearing? N  Vision? Y  Difficulty concentrating or making decisions? N  Walking or climbing stairs? N  Dressing or bathing? N  Doing errands, shopping? N  Preparing Food and eating ? N  Using the Toilet? N  In the past six months, have you accidently leaked urine? N  Do you have problems with loss of bowel control? N  Managing your Medications? N  Managing your Finances? N  Housekeeping or managing your Housekeeping? N  Some recent data might be hidden    Patient Care Team: Abner Greenspan, MD as PCP - General Clent Jacks, MD as Consulting Physician (Ophthalmology)    Assessment:     Hearing Screening   125Hz  250Hz  500Hz  1000Hz  2000Hz  3000Hz  4000Hz   6000Hz  8000Hz   Right ear:   40 40 40  0    Left ear:   40 40 40  0    Vision Screening Comments: Future vision exam scheduled 09/08/16 with Dr. Katy Fitch   Exercise Activities and Dietary recommendations Current Exercise Habits: Home exercise routine;Structured exercise class, Type of exercise: walking;Other - see comments (chair aerobics; walks 5-6 miles when weather permits), Time (Minutes): 60, Frequency (Times/Week): 7, Weekly Exercise (Minutes/Week): 420, Intensity: Moderate, Exercise limited by: None identified  Goals    . Increase physical activity          SMART goal: Starting 09/03/15, patient will exercise for 1 hr 3 days per week as tolerated and weather  permits.     . Increase physical activity          Starting 09/03/2016, I will continue to walk at least 5-6 miles daily as weather permits.       Fall Risk Fall Risk  09/03/2016 09/03/2015 06/24/2014 09/04/2012  Falls in the past year? No No No No   Depression Screen PHQ 2/9 Scores 09/03/2016 09/03/2015 06/24/2014 09/04/2012  PHQ - 2 Score 0 0 0 0     Cognitive Function MMSE - Mini Mental State Exam 09/03/2016  Orientation to time 5  Orientation to Place 5  Registration 3  Attention/ Calculation 0  Recall 3  Language- name 2 objects 0  Language- repeat 1  Language- follow 3 step command 3  Language- read & follow direction 0  Write a sentence 0  Copy design 0  Total score 20       PLEASE NOTE: A Mini-Cog screen was completed. Maximum score is 20. A value of 0 denotes this part of Folstein MMSE was not completed or the patient failed this part of the Mini-Cog screening.   Mini-Cog Screening Orientation to Time - Max 5 pts Orientation to Place - Max 5 pts Registration - Max 3 pts Recall - Max 3 pts Language Repeat - Max 1 pts Language Follow 3 Step Command - Max 3 pts   Immunization History  Administered Date(s) Administered  . Influenza Split 07/29/2011, 06/15/2012  . Influenza,inj,Quad PF,36+ Mos 06/24/2014, 09/03/2015, 06/10/2016  . Pneumococcal Conjugate-13 06/24/2014  . Pneumococcal Polysaccharide-23 12/28/2011  . Td 12/28/2011   Screening Tests Health Maintenance  Topic Date Due  . ZOSTAVAX  08/27/2020 (Originally 06/29/2006)  . MAMMOGRAM  01/15/2017  . COLONOSCOPY  09/19/2017  . TETANUS/TDAP  12/27/2021  . INFLUENZA VACCINE  Completed  . DEXA SCAN  Completed  . Hepatitis C Screening  Completed  . PNA vac Low Risk Adult  Completed      Plan:     I have personally reviewed and addressed the Medicare Annual Wellness questionnaire and have noted the following in the patient's chart:  A. Medical and social history B. Use of alcohol, tobacco or illicit drugs   C. Current medications and supplements D. Functional ability and status E.  Nutritional status F.  Physical activity G. Advance directives H. List of other physicians I.  Hospitalizations, surgeries, and ER visits in previous 12 months J.  Golden to include hearing, vision, cognitive, depression L. Referrals and appointments - none  In addition, I have reviewed and discussed with patient certain preventive protocols, quality metrics, and best practice recommendations. A written personalized care plan for preventive services as well as general preventive health recommendations were provided to patient.  See attached scanned questionnaire for additional information.   Signed,  Lindell Noe, MHA, BS, LPN Health Coach

## 2016-09-03 NOTE — Progress Notes (Signed)
Pre visit review using our clinic review tool, if applicable. No additional management support is needed unless otherwise documented below in the visit note. 

## 2016-09-03 NOTE — Progress Notes (Signed)
PCP notes:   Health maintenance:  Hep C screening - completed Shingles - postponed/financial  Abnormal screenings:   Hearing - failed  Patient concerns:   None  Nurse concerns:  None  Next PCP appt:   09/10/16 @ 1115  I reviewed health advisor's note, was available for consultation, and agree with documentation and plan. Loura Pardon MD

## 2016-09-04 LAB — HEPATITIS C ANTIBODY: HCV AB: NEGATIVE

## 2016-09-07 DIAGNOSIS — H35373 Puckering of macula, bilateral: Secondary | ICD-10-CM | POA: Diagnosis not present

## 2016-09-07 DIAGNOSIS — H25813 Combined forms of age-related cataract, bilateral: Secondary | ICD-10-CM | POA: Diagnosis not present

## 2016-09-07 DIAGNOSIS — H04123 Dry eye syndrome of bilateral lacrimal glands: Secondary | ICD-10-CM | POA: Diagnosis not present

## 2016-09-10 ENCOUNTER — Ambulatory Visit (INDEPENDENT_AMBULATORY_CARE_PROVIDER_SITE_OTHER): Payer: Medicare Other | Admitting: Family Medicine

## 2016-09-10 ENCOUNTER — Encounter: Payer: Self-pay | Admitting: Family Medicine

## 2016-09-10 VITALS — BP 128/72 | HR 54 | Temp 97.7°F | Ht 63.5 in | Wt 134.5 lb

## 2016-09-10 DIAGNOSIS — Z0000 Encounter for general adult medical examination without abnormal findings: Secondary | ICD-10-CM | POA: Diagnosis not present

## 2016-09-10 DIAGNOSIS — E2839 Other primary ovarian failure: Secondary | ICD-10-CM

## 2016-09-10 DIAGNOSIS — E7800 Pure hypercholesterolemia, unspecified: Secondary | ICD-10-CM | POA: Diagnosis not present

## 2016-09-10 DIAGNOSIS — M8589 Other specified disorders of bone density and structure, multiple sites: Secondary | ICD-10-CM

## 2016-09-10 MED ORDER — ATORVASTATIN CALCIUM 10 MG PO TABS: 10.0000 mg | ORAL_TABLET | Freq: Every day | ORAL | 11 refills | Status: DC

## 2016-09-10 NOTE — Patient Instructions (Addendum)
Keep doing self breast exams regularly  Mammogram is due in June   If you are interested in a shingles/zoster vaccine - call your insurance to check on coverage,( you should not get it within 1 month of other vaccines) , then call us for a prescription  for it to take to a pharmacy that gives the shot , or make a nurse visit to get it here depending on your coverage   Stop at check out for referral for dexa   Start atorvastatin 10 mg   1/2 pill daily in the evening  If any side effects let me know  Fasting labs in 6 weeks   You can make an appt with Dr Lorelei Pont ([facility]port [facility]) any time for your foot problem

## 2016-09-10 NOTE — Assessment & Plan Note (Signed)
Reviewed health habits including diet and exercise and skin cancer prevention Reviewed appropriate screening tests for age  Also reviewed health mt list, fam hx and immunization status , as well as social and family history   See HPI Hep C screen neg Mammogram neg in June-enc self breast exams  S/p hysterectomy with no gyn symptoms  Will check on coverage of zoster vaccine  Colonoscopy recall will be 2/19 dexa ordered Labs reviewed

## 2016-09-10 NOTE — Assessment & Plan Note (Signed)
Disc goals for lipids and reasons to control them Rev labs with pt Rev low sat fat diet in detail  Suspect hereditary inc chol- with high LDL  Will try atorvastatin 10 mg daily-disc poss side eff incl muscle pain , inst to alert [place] if any side eff Re check lipid in 6 wk

## 2016-09-10 NOTE — Progress Notes (Signed)
Subjective:    Patient ID: Chloe Ali, female    DOB: 12/04/1945, 71 y.o.   MRN: [id number]  HPI Here for health maintenance exam and to review chronic medical problems    Feeling really good now  Had a hard time turning 70 in dec  Not as much exercise due to the weather  She does have an exercise routine and chair aerobics class/ can do at home  Also gets out and walks    Wt Readings from Last 3 Encounters:  09/10/16 134 lb 8 oz (61 kg)  09/03/16 135 lb 8 oz (61.5 kg)  11/21/15 131 lb (59.4 kg)  mt her weight very well-makes an effort  bmi is 23.4  Had amw visit 2/9 Failed hearing exam - highest freq both ears  She is not bothered by hearing   Hep C screen neg  Mammogram 6/17 normal Self breast exam - no lumps or changes  fam hx of b ca in sister   Pap/gyn care : nl pap per pt 11/08 Had a hysterectomy 12/16 No symptoms or issues   Zoster vaccine -may be interested if covered   UTD other vaccines   Colonoscopy/ screening : colonoscopy 2/09 showed melanosis coli/ 10 y recall   dexa 1/16-osteopenia  No falls  No fractures  She takes her ca and D  Walks 4-6 miles per day  Quit her MVI -did not need it  Taking ocuvite - for early macular degen    Hx of hyperlipidemia Lab Results  Component Value Date   CHOL 273 (H) 09/03/2016   CHOL 244 (H) 08/27/2015   CHOL 255 (H) 06/17/2014   Lab Results  Component Value Date   HDL 55.60 09/03/2016   HDL 51.40 08/27/2015   HDL 54.60 06/17/2014   Lab Results  Component Value Date   LDLCALC 178 (H) 09/03/2016   LDLCALC 165 (H) 06/17/2014   LDLCALC 118 (H) 10/04/2007   Lab Results  Component Value Date   TRIG 196.0 (H) 09/03/2016   TRIG 273.0 (H) 08/27/2015   TRIG 177.0 (H) 06/17/2014   Lab Results  Component Value Date   CHOLHDL 5 09/03/2016   CHOLHDL 5 08/27/2015   CHOLHDL 5 06/17/2014   Lab Results  Component Value Date   LDLDIRECT 143.0 08/27/2015   LDLDIRECT 149.7 01/02/2013   LDLDIRECT  140.7 12/21/2011    Eats healthy - does not eat sat or trans fats  Eats beans for protein  Eats oatmeal  Has hereditary high cholesterol    Results for orders placed or performed in visit on 09/03/16  CBC with Differential/Platelet  Result Value Ref Range   WBC 6.0 4.0 - 10.5 K/uL   RBC 4.40 3.87 - 5.11 Mil/uL   Hemoglobin 14.2 12.0 - 15.0 g/dL   HCT 41.6 36.0 - 46.0 %   MCV 94.5 78.0 - 100.0 fl   MCHC 34.1 30.0 - 36.0 g/dL   RDW 12.9 11.5 - 15.5 %   Platelets 286.0 150.0 - 400.0 K/uL   Neutrophils Relative % 48.0 43.0 - 77.0 %   Lymphocytes Relative 41.8 12.0 - 46.0 %   Monocytes Relative 8.1 3.0 - 12.0 %   Eosinophils Relative 1.6 0.0 - 5.0 %   Basophils Relative 0.5 0.0 - 3.0 %   Neutro Abs 2.9 1.4 - 7.7 K/uL   Lymphs Abs 2.5 0.7 - 4.0 K/uL   Monocytes Absolute 0.5 0.1 - 1.0 K/uL   Eosinophils Absolute 0.1 0.0 - 0.7 K/uL  Basophils Absolute 0.0 0.0 - 0.1 K/uL  Comprehensive metabolic panel  Result Value Ref Range   Sodium 135 135 - 145 mEq/L   Potassium 4.6 3.5 - 5.1 mEq/L   Chloride 100 96 - 112 mEq/L   CO2 29 19 - 32 mEq/L   Glucose, Bld 91 70 - 99 mg/dL   BUN 13 6 - 23 mg/dL   Creatinine, Ser 0.79 0.40 - 1.20 mg/dL   Total Bilirubin 0.7 0.2 - 1.2 mg/dL   Alkaline Phosphatase 79 39 - 117 U/L   AST 20 0 - 37 U/L   ALT 18 0 - 35 U/L   Total Protein 7.5 6.0 - 8.3 g/dL   Albumin 4.7 3.5 - 5.2 g/dL   Calcium 10.3 8.4 - 10.5 mg/dL   GFR 76.43 >60.00 mL/min  Lipid panel  Result Value Ref Range   Cholesterol 273 (H) 0 - 200 mg/dL   Triglycerides 196.0 (H) 0.0 - 149.0 mg/dL   HDL 55.60 >39.00 mg/dL   VLDL 39.2 0.0 - 40.0 mg/dL   LDL Cholesterol 178 (H) 0 - 99 mg/dL   Total CHOL/HDL Ratio 5    NonHDL 217.18   TSH  Result Value Ref Range   TSH 2.45 0.35 - 4.50 uIU/mL  Hepatitis C antibody  Result Value Ref Range   HCV Ab NEGATIVE NEGATIVE    Patient Active Problem List   Diagnosis Date Noted  . Routine general medical examination at a health care facility  08/26/2015  . Osteopenia 08/16/2014  . Estrogen deficiency 06/24/2014  . Irritable bowel syndrome 09/04/2012  . Eczema 12/28/2011  . ALLERGIC RHINITIS CAUSE UNSPECIFIED 03/26/2010  . HYPERCHOLESTEROLEMIA 08/23/2007   Past Medical History:  Diagnosis Date  . Hay fever   . History of chicken pox   . HLD (hyperlipidemia)    Past Surgical History:  Procedure Laterality Date  . ANTERIOR AND POSTERIOR REPAIR N/A 07/22/2015   Procedure: ANTERIOR (CYSTOCELE) AND POSTERIOR REPAIR (RECTOCELE);  Surgeon: Bjorn Loser, MD;  Location: [facility];  Service: Urology;  Laterality: N/A;  . CYSTO N/A 07/22/2015   Procedure: Kathrene Alu;  Surgeon: Bjorn Loser, MD;  Location: [facility];  Service: Urology;  Laterality: N/A;  . TUBAL LIGATION  1976  . VAGINAL HYSTERECTOMY N/A 07/22/2015   Procedure: HYSTERECTOMY VAGINAL;  Surgeon: Donnamae Jude, MD;  Location: [facility];  Service: Gynecology;  Laterality: N/A;  Requested 07/22/15 @ 7:30a with Dr. Rogue Bussing to follow   Social History  Substance Use Topics  . Smoking status: Never Smoker  . Smokeless tobacco: Never Used  . Alcohol use 0.0 oz/week     Comment: Social   Family History  Problem Relation Age of Onset  . Pancreatic cancer Mother   . Diabetes Mother   . Cancer Mother     pancreatic  . Lung cancer Father   . Cancer Father     lung  . Stroke Maternal Grandfather   . Breast cancer Sister   . Cancer Sister 77    breast   No Known Allergies Current Outpatient Prescriptions on File Prior to Visit  Medication Sig Dispense Refill  . cholecalciferol (VITAMIN D) 1000 UNITS tablet Take 1,000 Units by mouth daily.    . Multiple Vitamins-Minerals (OCUVITE PO) Take 1 tablet by mouth daily.    . Omega-3 Fatty Acids (FISH OIL PO) Take 1 capsule by mouth daily.     No current facility-administered medications on file prior to visit.      Review of Systems  Review of Systems  Constitutional: Negative for fever, appetite change, fatigue and  unexpected weight change.  Eyes: Negative for pain and visual disturbance.  Respiratory: Negative for cough and shortness of breath.   Cardiovascular: Negative for cp or palpitations    Gastrointestinal: Negative for nausea, diarrhea and constipation.  Genitourinary: Negative for urgency and frequency.  Skin: Negative for pallor or rash   Neurological: Negative for weakness, light-headedness, numbness and headaches.  Hematological: Negative for adenopathy. Does not bruise/bleed easily.  Psychiatric/Behavioral: Negative for dysphoric mood. The patient is not nervous/anxious.         Objective:   Physical Exam  Constitutional: She appears well-developed and well-nourished. No distress.  Well appearing   HENT:  Head: Normocephalic and atraumatic.  Right Ear: External ear normal.  Left Ear: External ear normal.  Mouth/Throat: Oropharynx is clear and moist.  Eyes: Conjunctivae and EOM are normal. Pupils are equal, round, and reactive to light. No scleral icterus.  Neck: Normal range of motion. Neck supple. No JVD present. Carotid bruit is not present. No thyromegaly present.  Cardiovascular: Normal rate, regular rhythm, normal heart sounds and intact distal pulses.  Exam reveals no gallop.   Pulmonary/Chest: Effort normal and breath sounds normal. No respiratory distress. She has no wheezes. She exhibits no tenderness.  Abdominal: Soft. Bowel sounds are normal. She exhibits no distension, no abdominal bruit and no mass. There is no tenderness.  Genitourinary: No breast swelling, tenderness, discharge or bleeding.  Genitourinary Comments: Breast exam: No mass, nodules, thickening, tenderness, bulging, retraction, inflamation, nipple discharge or skin changes noted.  No axillary or clavicular LA.      Musculoskeletal: Normal range of motion. She exhibits no edema or tenderness.  No kyphosis   Lymphadenopathy:    She has no cervical adenopathy.  Neurological: She is alert. She has normal  reflexes. No cranial nerve deficit. She exhibits normal muscle tone. Coordination normal.  Skin: Skin is warm and dry. No rash noted. No erythema. No pallor.  Lentigines diffusely  Psychiatric: She has a normal mood and affect.          Assessment & Plan:   Problem List Items Addressed This Visit      Musculoskeletal and Integument   Osteopenia - Primary    Ordered f/u dexa (2 y) No falls or fx Disc need for calcium/ vitamin D/ wt bearing exercise and bone density test every 2 y to monitor Disc safety/ fracture risk in detail          Other   Estrogen deficiency   Relevant Orders   DG Bone Density   HYPERCHOLESTEROLEMIA    Disc goals for lipids and reasons to control them Rev labs with pt Rev low sat fat diet in detail  Suspect hereditary inc chol- with high LDL  Will try atorvastatin 10 mg daily-disc poss side eff incl muscle pain , inst to alert [place] if any side eff Re check lipid in 6 wk        Relevant Medications   atorvastatin (LIPITOR) 10 MG tablet   Routine general medical examination at a health care facility    Reviewed health habits including diet and exercise and skin cancer prevention Reviewed appropriate screening tests for age  Also reviewed health mt list, fam hx and immunization status , as well as social and family history   See HPI Hep C screen neg Mammogram neg in June-enc self breast exams  S/p hysterectomy with no gyn symptoms  Will check on  coverage of zoster vaccine  Colonoscopy recall will be 2/19 dexa ordered Labs reviewed

## 2016-09-10 NOTE — Assessment & Plan Note (Signed)
Ordered f/u dexa (2 y) No falls or fx Disc need for calcium/ vitamin D/ wt bearing exercise and bone density test every 2 y to monitor Disc safety/ fracture risk in detail

## 2016-09-10 NOTE — Progress Notes (Signed)
Pre visit review using our clinic review tool, if applicable. No additional management support is needed unless otherwise documented below in the visit note. 

## 2016-10-26 ENCOUNTER — Other Ambulatory Visit: Payer: Self-pay | Admitting: *Deleted

## 2016-10-26 MED ORDER — ATORVASTATIN CALCIUM 10 MG PO TABS: 10.0000 mg | ORAL_TABLET | Freq: Every day | ORAL | 3 refills | Status: DC

## 2016-10-26 NOTE — Telephone Encounter (Signed)
Received fax requesting Rx to be changed to 90 day supply for insurance, done

## 2016-11-01 ENCOUNTER — Ambulatory Visit (INDEPENDENT_AMBULATORY_CARE_PROVIDER_SITE_OTHER): Payer: Medicare Other | Admitting: Family Medicine

## 2016-11-01 ENCOUNTER — Telehealth: Payer: Self-pay | Admitting: Family Medicine

## 2016-11-01 ENCOUNTER — Encounter (INDEPENDENT_AMBULATORY_CARE_PROVIDER_SITE_OTHER): Payer: Self-pay

## 2016-11-01 NOTE — Telephone Encounter (Signed)
Pt states that she needs a referral to cardiology, would like to go to [facility] on [address].  She is expecting their call to schedule.  Best number to call [phone] / lt

## 2016-11-01 NOTE — Telephone Encounter (Signed)
Pt needs the referral for her spouse Jenny Reichmann, not for herself.  Please disregard previous note.

## 2016-11-02 ENCOUNTER — Telehealth: Payer: Self-pay | Admitting: Family Medicine

## 2016-11-02 DIAGNOSIS — E7800 Pure hypercholesterolemia, unspecified: Secondary | ICD-10-CM

## 2016-11-02 NOTE — Telephone Encounter (Signed)
-----   Message from Ellamae Sia sent at 11/02/2016  3:50 PM EDT ----- Regarding: Lab orders for Wednesday, 4.11.18 Lab orders for a 6 week follow up appt

## 2016-11-03 ENCOUNTER — Other Ambulatory Visit (INDEPENDENT_AMBULATORY_CARE_PROVIDER_SITE_OTHER): Payer: Medicare Other

## 2016-11-03 ENCOUNTER — Ambulatory Visit: Payer: Self-pay

## 2016-11-03 DIAGNOSIS — E7800 Pure hypercholesterolemia, unspecified: Secondary | ICD-10-CM

## 2016-11-03 LAB — ALT: ALT: 19 U/L (ref 0–35)

## 2016-11-03 LAB — LIPID PANEL
CHOL/HDL RATIO: 3
Cholesterol: 161 mg/dL (ref 0–200)
HDL: 59.6 mg/dL (ref >39.00)
LDL Cholesterol: 77 mg/dL (ref 0–99)
NONHDL: 101.23
Triglycerides: 119 mg/dL (ref 0.0–149.0)
VLDL: 23.8 mg/dL (ref 0.0–40.0)

## 2016-11-03 LAB — AST: AST: 17 U/L (ref 0–37)

## 2016-11-03 NOTE — Progress Notes (Signed)
Erroneous encounter

## 2016-11-04 ENCOUNTER — Encounter: Payer: Self-pay | Admitting: *Deleted

## 2016-11-09 ENCOUNTER — Other Ambulatory Visit: Payer: Self-pay

## 2016-11-12 ENCOUNTER — Ambulatory Visit (INDEPENDENT_AMBULATORY_CARE_PROVIDER_SITE_OTHER): Payer: Medicare Other | Admitting: Family Medicine

## 2016-11-12 ENCOUNTER — Encounter: Payer: Self-pay | Admitting: Family Medicine

## 2016-11-12 VITALS — BP 128/72 | HR 64 | Temp 98.3°F | Ht 63.5 in | Wt 133.5 lb

## 2016-11-12 DIAGNOSIS — E7800 Pure hypercholesterolemia, unspecified: Secondary | ICD-10-CM

## 2016-11-12 DIAGNOSIS — M8589 Other specified disorders of bone density and structure, multiple sites: Secondary | ICD-10-CM | POA: Diagnosis not present

## 2016-11-12 NOTE — Patient Instructions (Addendum)
Take care of yourself  Cholesterol is much much improved Continue atorvastatin and good diet   Re schedule your bone density test when you can

## 2016-11-12 NOTE — Progress Notes (Signed)
Subjective:    Patient ID: Chloe Ali, female    DOB: 03-15-1946, 71 y.o.   MRN: [id number]  HPI Here for f/u of chronic health problems    Wt is 133.8 Stable bmi of 23  BP Readings from Last 3 Encounters:  11/12/16 128/72  09/10/16 128/72  09/03/16 120/80   Hx of hyperlipidemia Started atorvastatin last time Lab Results  Component Value Date   CHOL 161 11/03/2016   CHOL 273 (H) 09/03/2016   CHOL 244 (H) 08/27/2015   Lab Results  Component Value Date   HDL 59.60 11/03/2016   HDL 55.60 09/03/2016   HDL 51.40 08/27/2015   Lab Results  Component Value Date   LDLCALC 77 11/03/2016   LDLCALC 178 (H) 09/03/2016   LDLCALC 165 (H) 06/17/2014   Lab Results  Component Value Date   TRIG 119.0 11/03/2016   TRIG 196.0 (H) 09/03/2016   TRIG 273.0 (H) 08/27/2015   Lab Results  Component Value Date   CHOLHDL 3 11/03/2016   CHOLHDL 5 09/03/2016   CHOLHDL 5 08/27/2015   Lab Results  Component Value Date   LDLDIRECT 143.0 08/27/2015   LDLDIRECT 149.7 01/02/2013   LDLDIRECT 140.7 12/21/2011   Big improvement ! Now on atorvastatin -tolerating well  She notes that prior to her higher cholesterol she was eating eggs per day  Also not eating as many eggs  Eating healthy  Exercising some / out doing as much as she can    She cancelled her bone density test - her husband needed a doctor visit that day  She will re schedule that  Caring for her husband- cardiac problems / will need surgery    Patient Active Problem List   Diagnosis Date Noted  . Routine general medical examination at a health care facility 08/26/2015  . Osteopenia 08/16/2014  . Estrogen deficiency 06/24/2014  . Irritable bowel syndrome 09/04/2012  . Eczema 12/28/2011  . ALLERGIC RHINITIS CAUSE UNSPECIFIED 03/26/2010  . HYPERCHOLESTEROLEMIA 08/23/2007   Past Medical History:  Diagnosis Date  . Hay fever   . History of chicken pox   . HLD (hyperlipidemia)    Past Surgical History:    Procedure Laterality Date  . ANTERIOR AND POSTERIOR REPAIR N/A 07/22/2015   Procedure: ANTERIOR (CYSTOCELE) AND POSTERIOR REPAIR (RECTOCELE);  Surgeon: Bjorn Loser, MD;  Location: [facility];  Service: Urology;  Laterality: N/A;  . CYSTO N/A 07/22/2015   Procedure: Kathrene Alu;  Surgeon: Bjorn Loser, MD;  Location: [facility];  Service: Urology;  Laterality: N/A;  . TUBAL LIGATION  1976  . VAGINAL HYSTERECTOMY N/A 07/22/2015   Procedure: HYSTERECTOMY VAGINAL;  Surgeon: Donnamae Jude, MD;  Location: [facility];  Service: Gynecology;  Laterality: N/A;  Requested 07/22/15 @ 7:30a with Dr. Rogue Bussing to follow   Social History  Substance Use Topics  . Smoking status: Never Smoker  . Smokeless tobacco: Never Used  . Alcohol use 0.0 oz/week     Comment: Social   Family History  Problem Relation Age of Onset  . Pancreatic cancer Mother   . Diabetes Mother   . Cancer Mother     pancreatic  . Lung cancer Father   . Cancer Father     lung  . Stroke Maternal Grandfather   . Breast cancer Sister   . Cancer Sister 22    breast   No Known Allergies Current Outpatient Prescriptions on File Prior to Visit  Medication Sig Dispense Refill  . atorvastatin (LIPITOR) 10 MG  tablet Take 1 tablet (10 mg total) by mouth daily. 90 tablet 3  . cholecalciferol (VITAMIN D) 1000 UNITS tablet Take 1,000 Units by mouth daily.    . Multiple Vitamins-Minerals (OCUVITE PO) Take 1 tablet by mouth daily.    . Omega-3 Fatty Acids (FISH OIL PO) Take 1 capsule by mouth daily.     No current facility-administered medications on file prior to visit.     Review of Systems Review of Systems  Constitutional: Negative for fever, appetite change, fatigue and unexpected weight change.  Eyes: Negative for pain and visual disturbance.  Respiratory: Negative for cough and shortness of breath.   Cardiovascular: Negative for cp or palpitations    Gastrointestinal: Negative for nausea, diarrhea and constipation.  Genitourinary:  Negative for urgency and frequency.  Skin: Negative for pallor or rash   Neurological: Negative for weakness, light-headedness, numbness and headaches.  Hematological: Negative for adenopathy. Does not bruise/bleed easily.  Psychiatric/Behavioral: Negative for dysphoric mood. The patient is not nervous/anxious.         Objective:   Physical Exam  Constitutional: She appears well-developed and well-nourished. No distress.  Well appearing   HENT:  Head: Normocephalic and atraumatic.  Mouth/Throat: Oropharynx is clear and moist.  Eyes: Conjunctivae and EOM are normal. Pupils are equal, round, and reactive to light.  Neck: Normal range of motion. Neck supple. No JVD present. Carotid bruit is not present. No thyromegaly present.  Cardiovascular: Normal rate, regular rhythm, normal heart sounds and intact distal pulses.  Exam reveals no gallop.   Pulmonary/Chest: Effort normal and breath sounds normal. No respiratory distress. She has no wheezes. She has no rales.  No crackles  Abdominal: Soft. Bowel sounds are normal. She exhibits no distension, no abdominal bruit and no mass. There is no tenderness.  Musculoskeletal: She exhibits no edema.  Lymphadenopathy:    She has no cervical adenopathy.  Neurological: She is alert. She has normal reflexes.  Skin: Skin is warm and dry. No rash noted.  Psychiatric: She has a normal mood and affect.          Assessment & Plan:   Problem List Items Addressed This Visit      Musculoskeletal and Integument   Osteopenia    Pt plans to re schedule her dexa        Other   HYPERCHOLESTEROLEMIA - Primary    Disc goals for lipids and reasons to control them Rev labs with pt Rev low sat fat diet in detail Much improved with atorvastatin 10  Tolerating well  Continue low sat/trans fat diet

## 2016-11-12 NOTE — Progress Notes (Signed)
Pre visit review using our clinic review tool, if applicable. No additional management support is needed unless otherwise documented below in the visit note. 

## 2016-11-14 NOTE — Assessment & Plan Note (Signed)
Disc goals for lipids and reasons to control them Rev labs with pt Rev low sat fat diet in detail Much improved with atorvastatin 10  Tolerating well  Continue low sat/trans fat diet

## 2016-11-14 NOTE — Assessment & Plan Note (Signed)
Pt plans to re schedule her dexa

## 2017-01-25 ENCOUNTER — Other Ambulatory Visit: Payer: Self-pay | Admitting: Family Medicine

## 2017-01-25 DIAGNOSIS — Z1231 Encounter for screening mammogram for malignant neoplasm of breast: Secondary | ICD-10-CM

## 2017-02-03 IMAGING — MG MM DIGITAL SCREENING BILAT W/ TOMO W/ CAD
8 of 13 series · 8 of 29 positions shown · non-contrast
Comparison: Previous exam(s).

CLINICAL DATA: Screening.

EXAM:
2D DIGITAL SCREENING BILATERAL MAMMOGRAM WITH CAD AND ADJUNCT TOMO

[R MLO]
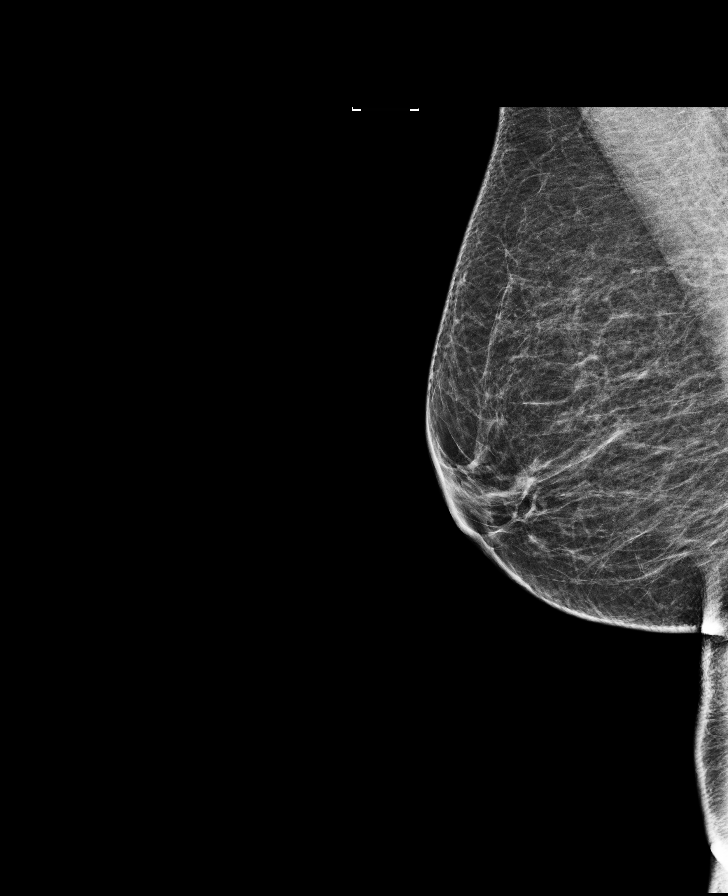

[L MLO]
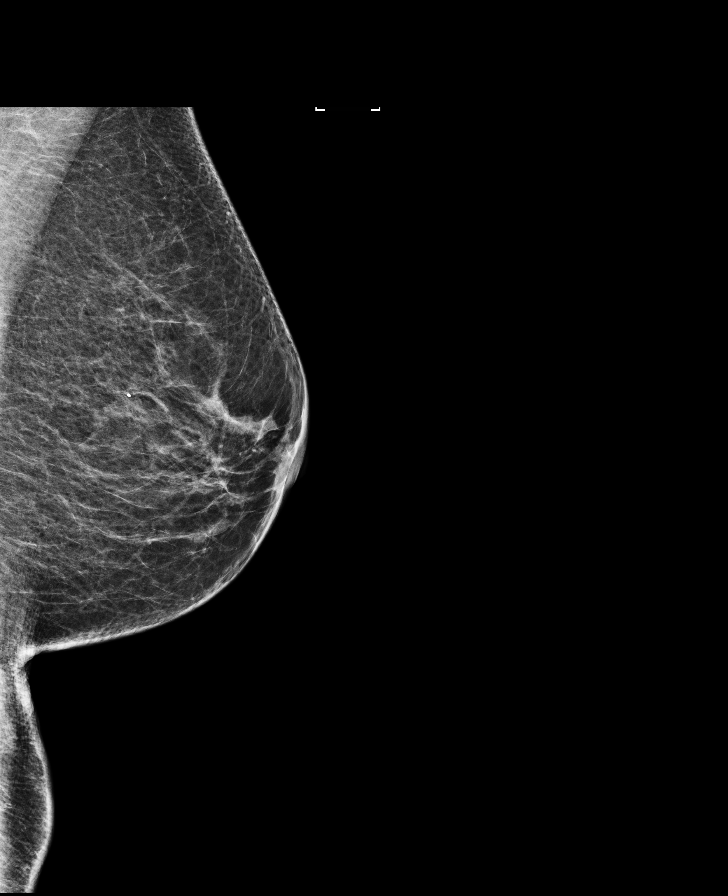

[L CC]
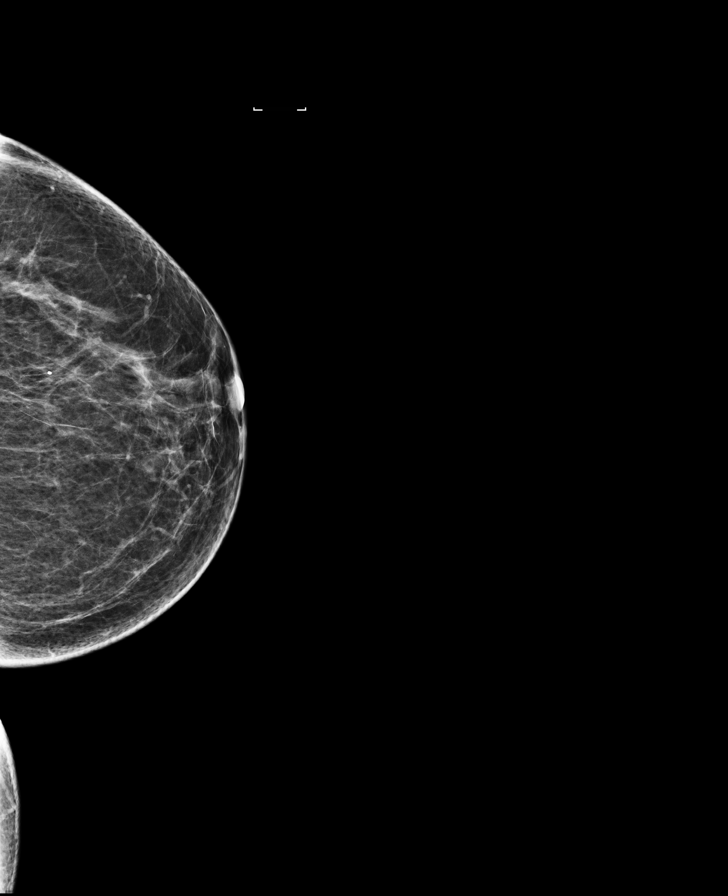

[R CC]
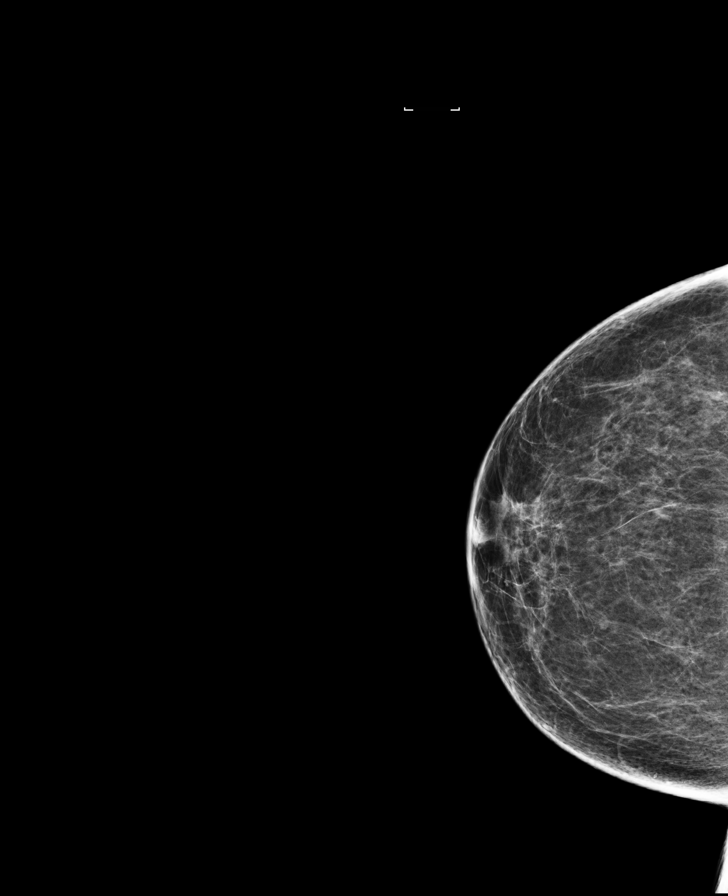

[R CC synth-2D]
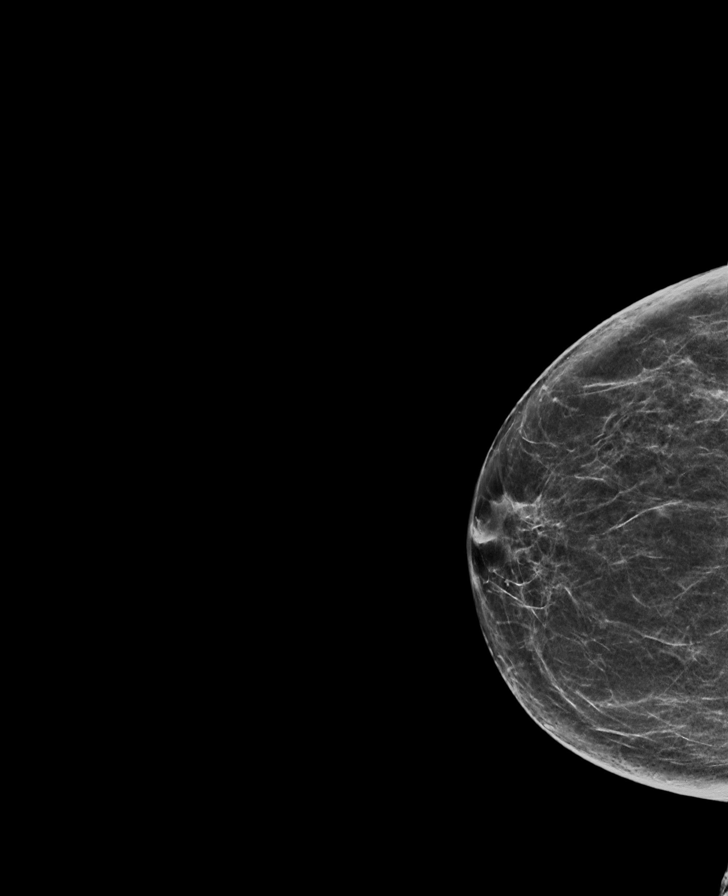

[R MLO synth-2D]
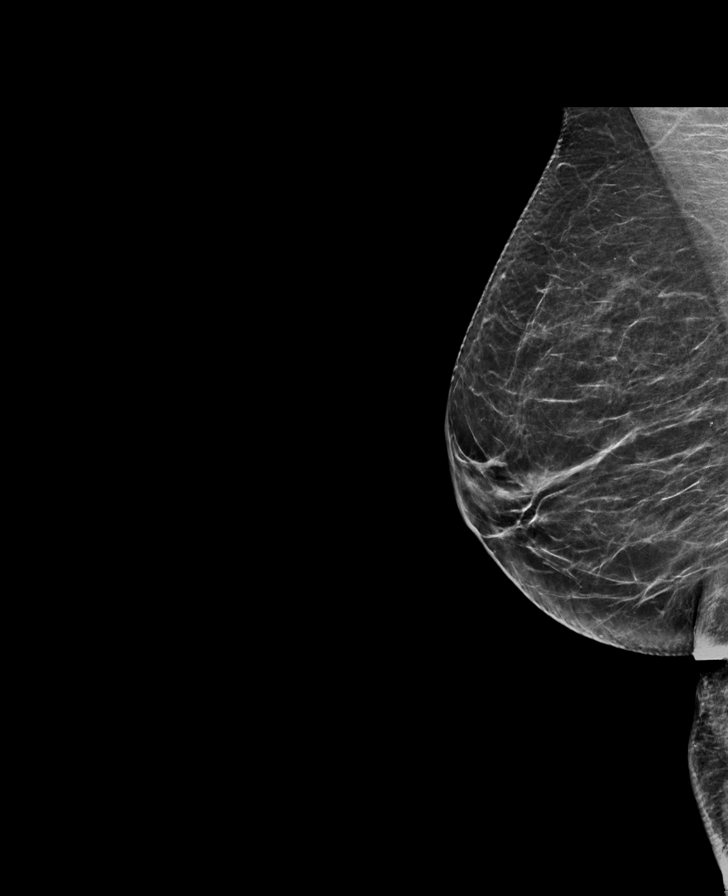

[L CC synth-2D]
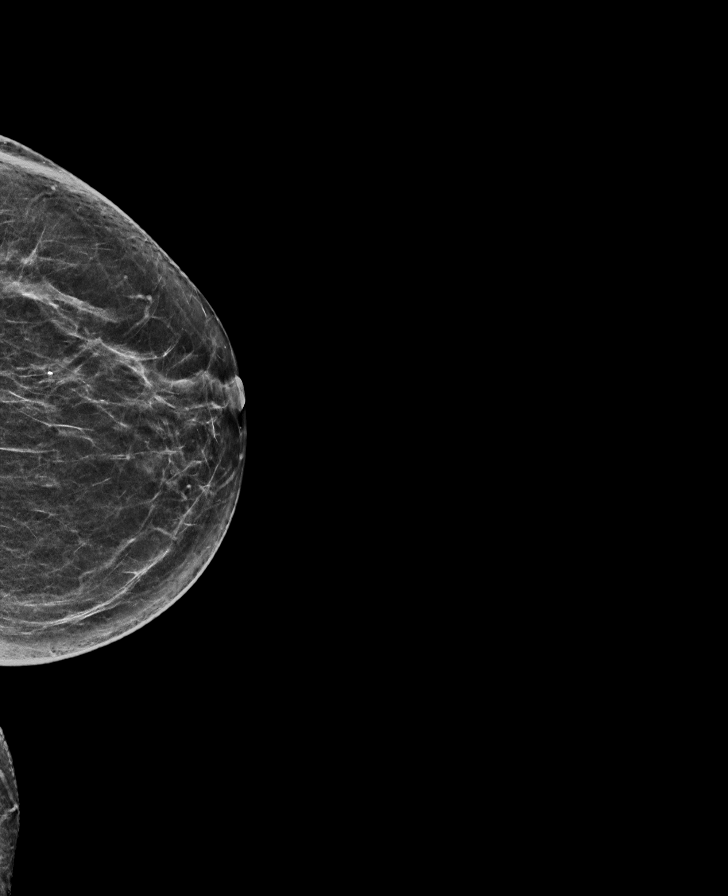

[L MLO synth-2D]
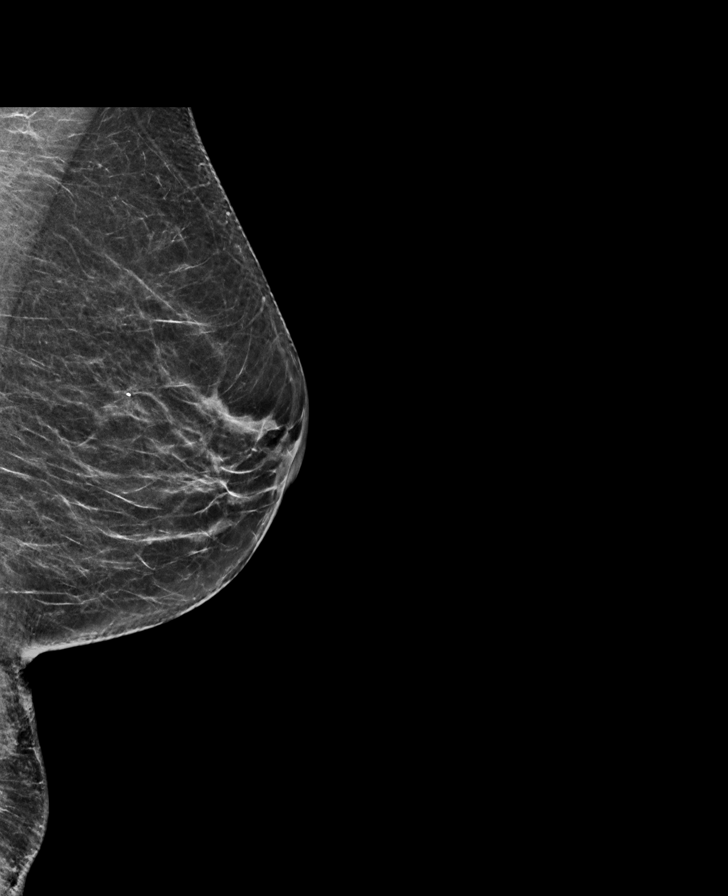

[8 of 29 positions shown; findings below may reference images not displayed]

ACR Breast Density Category b: There are scattered areas of
fibroglandular density.
FINDINGS: There are no findings suspicious for malignancy. Images were
processed with CAD.
IMPRESSION: No mammographic evidence of malignancy. A result letter of this
screening mammogram will be mailed directly to the patient.

RECOMMENDATION:
Screening mammogram in one year. (Code:97-6-RS4)

BI-RADS CATEGORY  1: Negative.

## 2017-02-23 ENCOUNTER — Encounter: Payer: Self-pay | Admitting: Family Medicine

## 2017-02-23 ENCOUNTER — Ambulatory Visit (INDEPENDENT_AMBULATORY_CARE_PROVIDER_SITE_OTHER): Payer: Medicare Other | Admitting: Family Medicine

## 2017-02-23 VITALS — BP 112/68 | HR 65 | Temp 98.4°F | Ht 69.5 in | Wt 132.0 lb

## 2017-02-23 DIAGNOSIS — M722 Plantar fascial fibromatosis: Secondary | ICD-10-CM | POA: Diagnosis not present

## 2017-02-23 NOTE — Progress Notes (Signed)
Dr. Frederico Hamman T. Tewana Bohlen, MD, [facility] [place], [address] Phone: [phone] Fax: [phone]  02/23/2017  Patient: Chloe Ali, MRN: [id number], DOB: [birth year], 71 y.o.  Primary Physician:  Tower, Wynelle Fanny, MD   Chief Complaint  Patient presents with  . Foot Pain    Right heel-doesn't hurt all the time   Subjective:   This 71 y.o. female patient presents with a 1 year long history of R heel pain. This is notable for worsening pain first thing in the morning when arising and standing after sitting.   Walks a lot. Mostly on the pvement. R posterior heel.  Tried some OTC insoles.  Pain when out of bed  PF on R  Prior foot or ankle fractures: none Prior operations: none Orthotics or bracing: none Medications: none PT or home rehab: some Night splints: no Ice massage: no Ball massage: no  Metatarsal pain: no  The PMH, PSH, Social History, Family History, Medications, and allergies have been reviewed in [facility], and have been updated if relevant.  Patient Active Problem List   Diagnosis Date Noted  . Routine general medical examination at a health care facility 08/26/2015  . Osteopenia 08/16/2014  . Estrogen deficiency 06/24/2014  . Irritable bowel syndrome 09/04/2012  . Eczema 12/28/2011  . ALLERGIC RHINITIS CAUSE UNSPECIFIED 03/26/2010  . HYPERCHOLESTEROLEMIA 08/23/2007    Past Medical History:  Diagnosis Date  . Hay fever   . History of chicken pox   . HLD (hyperlipidemia)     Past Surgical History:  Procedure Laterality Date  . ANTERIOR AND POSTERIOR REPAIR N/A 07/22/2015   Procedure: ANTERIOR (CYSTOCELE) AND POSTERIOR REPAIR (RECTOCELE);  Surgeon: Bjorn Loser, MD;  Location: [facility];  Service: Urology;  Laterality: N/A;  . CYSTO N/A 07/22/2015   Procedure: Kathrene Alu;  Surgeon: Bjorn Loser, MD;  Location: [facility];  Service: Urology;  Laterality: N/A;  . TUBAL LIGATION  1976  . VAGINAL  HYSTERECTOMY N/A 07/22/2015   Procedure: HYSTERECTOMY VAGINAL;  Surgeon: Donnamae Jude, MD;  Location: [facility];  Service: Gynecology;  Laterality: N/A;  Requested 07/22/15 @ 7:30a with Dr. Rogue Bussing to follow    Social History   Social History  . Marital status: Married    Spouse name: N/A  . Number of children: 4  . Years of education: N/A   Occupational History  . Retired    Social History Main Topics  . Smoking status: Never Smoker  . Smokeless tobacco: Never Used  . Alcohol use 0.0 oz/week     Comment: Social  . Drug use: No  . Sexual activity: Not Currently    Birth control/ protection: Post-menopausal, Surgical   Other Topics Concern  . Not on file   Social History Narrative   Married      4 children      Husband on disability after car accident      Retired      No regular exercise             Family History  Problem Relation Age of Onset  . Pancreatic cancer Mother   . Diabetes Mother   . Cancer Mother        pancreatic  . Lung cancer Father   . Cancer Father        lung  . Stroke Maternal Grandfather   . Breast cancer Sister   . Cancer Sister 26  breast    No Known Allergies  Medication list reviewed and updated in full in [facility].  GEN: No fevers, chills. Nontoxic. Primarily MSK c/o today. MSK: Detailed in the HPI GI: tolerating PO intake without difficulty Neuro: No numbness, parasthesias, or tingling associated. Otherwise the pertinent positives of the ROS are noted above.   Objective:   Blood pressure 112/68, pulse 65, temperature 98.4 F (36.9 C), temperature source Oral, height 5' 9.5" (1.765 m), weight 132 lb (59.9 kg).  GEN: Well-developed,well-nourished,in no acute distress; alert,appropriate and cooperative throughout examination HEENT: Normocephalic and atraumatic without obvious abnormalities. Ears, externally no deformities PULM: Breathing comfortably in no respiratory distress EXT: No clubbing, cyanosis, or  edema PSYCH: Normally interactive. Cooperative during the interview. Pleasant. Friendly and conversant. Not anxious or depressed appearing. Normal, full affect.  R foot Echymosis: no Edema: no ROM: full LE B Gait: heel toe, non-antalgic MT pain: no Callus pattern: none Lateral Mall: NT Medial Mall: NT Talus: NT Navicular: NT Calcaneous: NT Metatarsals: NT 5th MT: NT Phalanges: NT Achilles: NT Plantar Fascia: tender, medial along PF. Pain with forced dorsi Fat Pad: NT Peroneals: NT Post Tib: NT Great Toe: Nml motion Ant Drawer: neg Other foot breakdown: none Long arch: preserved, cavus Transverse arch: preserved Hindfoot breakdown: none Sensation: intact  Assessment and Plan:   Plantar fasciitis, right  >25 minutes spent in face to face time with patient, >50% spent in counselling or coordination of care  Currently not that bad Anatomy reviewed. Stretching and rehab are critically important to the treatment of PF. Reviewed footwear. Rigid soles have been shown to help with PF.  Reviewed rehab of stretching and calf raises.  Reviewed rehab from [facility]  Patient Instructions  Please read handouts on Plantar Fascitis.    STRETCHING and Strengthening program critically important.    Strengthening on foot and calf muscles as seen in handout.  Calf raises, 2 legged, then 1 legged.  Foot massage with tennis ball.  Ice massage.  Towel Scrunches: get a towel or hand towel, use toes to pick up and scrunch up the towel.  Marble pick-ups, practice picking up marbles with toes and placing into a cup  NEEDS TO BE DONE EVERY DAY    Recommended over the counter insoles. (Spenco or Hapad)  A rigid shoe with good arch support helps: Dansko (great), Jennet Maduro, Merrell No easily bendable shoes.   Tuli's heel cups     Signed,  Jerime Arif T. Sayda Grable, MD   Patient's Medications  New Prescriptions   No medications on file  Previous  Medications   ATORVASTATIN (LIPITOR) 10 MG TABLET    Take 1 tablet (10 mg total) by mouth daily.   CHOLECALCIFEROL (VITAMIN D) 1000 UNITS TABLET    Take 1,000 Units by mouth daily.   MULTIPLE VITAMINS-MINERALS (OCUVITE PO)    Take 1 tablet by mouth daily.   OMEGA-3 FATTY ACIDS (FISH OIL PO)    Take 1 capsule by mouth daily.  Modified Medications   No medications on file  Discontinued Medications   No medications on file

## 2017-02-23 NOTE — Patient Instructions (Signed)

## 2017-03-15 ENCOUNTER — Ambulatory Visit
Admission: RE | Admit: 2017-03-15 | Discharge: 2017-03-15 | Disposition: A | Discharge status: Home / Self Care | Payer: Medicare Other | Source: Ambulatory Visit | Attending: Family Medicine | Admitting: Family Medicine

## 2017-03-15 DIAGNOSIS — Z1231 Encounter for screening mammogram for malignant neoplasm of breast: Secondary | ICD-10-CM | POA: Insufficient documentation

## 2017-03-15 DIAGNOSIS — Z13820 Encounter for screening for osteoporosis: Secondary | ICD-10-CM | POA: Diagnosis present

## 2017-03-15 DIAGNOSIS — E2839 Other primary ovarian failure: Secondary | ICD-10-CM

## 2017-03-15 DIAGNOSIS — M8588 Other specified disorders of bone density and structure, other site: Secondary | ICD-10-CM | POA: Diagnosis not present

## 2017-03-15 DIAGNOSIS — M8589 Other specified disorders of bone density and structure, multiple sites: Secondary | ICD-10-CM | POA: Diagnosis not present

## 2017-03-15 DIAGNOSIS — Z780 Asymptomatic menopausal state: Secondary | ICD-10-CM | POA: Diagnosis not present

## 2017-03-15 LAB — HM MAMMOGRAPHY

## 2017-03-16 ENCOUNTER — Encounter: Payer: Self-pay | Admitting: Family Medicine

## 2017-04-21 DIAGNOSIS — H25813 Combined forms of age-related cataract, bilateral: Secondary | ICD-10-CM | POA: Diagnosis not present

## 2017-04-21 DIAGNOSIS — H35371 Puckering of macula, right eye: Secondary | ICD-10-CM | POA: Diagnosis not present

## 2017-04-26 DIAGNOSIS — L718 Other rosacea: Secondary | ICD-10-CM | POA: Diagnosis not present

## 2017-04-26 DIAGNOSIS — L821 Other seborrheic keratosis: Secondary | ICD-10-CM | POA: Diagnosis not present

## 2017-04-26 DIAGNOSIS — L820 Inflamed seborrheic keratosis: Secondary | ICD-10-CM | POA: Diagnosis not present

## 2017-04-26 DIAGNOSIS — I788 Other diseases of capillaries: Secondary | ICD-10-CM | POA: Diagnosis not present

## 2017-05-16 DIAGNOSIS — H2512 Age-related nuclear cataract, left eye: Secondary | ICD-10-CM | POA: Diagnosis not present

## 2017-06-22 DIAGNOSIS — H2511 Age-related nuclear cataract, right eye: Secondary | ICD-10-CM | POA: Diagnosis not present

## 2017-06-25 HISTORY — PX: CATARACT EXTRACTION W/ INTRAOCULAR LENS IMPLANT: SHX1309

## 2017-06-27 DIAGNOSIS — H2511 Age-related nuclear cataract, right eye: Secondary | ICD-10-CM | POA: Diagnosis not present

## 2017-07-25 ENCOUNTER — Ambulatory Visit: Payer: Medicare Other | Admitting: Family Medicine

## 2017-07-25 ENCOUNTER — Encounter: Payer: Self-pay | Admitting: Family Medicine

## 2017-07-25 VITALS — BP 124/76 | HR 65 | Temp 98.0°F | Wt 135.0 lb

## 2017-07-25 DIAGNOSIS — B029 Zoster without complications: Secondary | ICD-10-CM | POA: Diagnosis not present

## 2017-07-25 DIAGNOSIS — Z8619 Personal history of other infectious and parasitic diseases: Secondary | ICD-10-CM | POA: Insufficient documentation

## 2017-07-25 MED ORDER — VALACYCLOVIR HCL 1 G PO TABS: 1000.0000 mg | ORAL_TABLET | Freq: Three times a day (TID) | ORAL | 0 refills | Status: DC

## 2017-07-25 MED ORDER — GABAPENTIN 100 MG PO CAPS: 100.0000 mg | ORAL_CAPSULE | Freq: Two times a day (BID) | ORAL | 0 refills | Status: DC | PRN

## 2017-07-25 NOTE — Assessment & Plan Note (Signed)
Rash most consistent with shingles. Treat with valtrex and gabapentin. Supportive care reviewed.  Discussed contact precautions as contagious. Pt will be interested in shingrix, advised to wait a few months prior to vaccine.

## 2017-07-25 NOTE — Progress Notes (Signed)
   BP 124/76 (BP Location: Left Arm, Patient Position: Sitting, Cuff Size: Normal)   Pulse 65   Temp 98 F (36.7 C) (Oral)   Wt 135 lb (61.2 kg)   SpO2 97%   BMI 23.54 kg/m    CC: rash "I think I have shingles" Subjective:    Patient ID: Chloe Ali, female    DOB: 03-25-1946, 71 y.o.   MRN: [id number]  HPI: OFILIA RAYON is a 71 y.o. female presenting on 07/25/2017 for Rash (Painful blisters located under left breast and around to back. Noticed 07/21/17. Thinks it may be shingles)   Blistering rash under L breast and into back started 4 days ago.  Started with pain under L breast radiation to L back >1 wk ago.  No fevers/chills, nausea.   Never got shingles shot.   Relevant past medical, surgical, family and social history reviewed and updated as indicated. Interim medical history since our last visit reviewed. Allergies and medications reviewed and updated. Outpatient Medications Prior to Visit  Medication Sig Dispense Refill  . atorvastatin (LIPITOR) 10 MG tablet Take 1 tablet (10 mg total) by mouth daily. 90 tablet 3  . cholecalciferol (VITAMIN D) 1000 UNITS tablet Take 1,000 Units by mouth daily.    . Multiple Vitamins-Minerals (OCUVITE PO) Take 1 tablet by mouth daily.    . Omega-3 Fatty Acids (FISH OIL PO) Take 1 capsule by mouth daily.     No facility-administered medications prior to visit.      Per HPI unless specifically indicated in ROS section below Review of Systems     Objective:    BP 124/76 (BP Location: Left Arm, Patient Position: Sitting, Cuff Size: Normal)   Pulse 65   Temp 98 F (36.7 C) (Oral)   Wt 135 lb (61.2 kg)   SpO2 97%   BMI 23.54 kg/m   Wt Readings from Last 3 Encounters:  07/25/17 135 lb (61.2 kg)  03/15/17 130 lb (59 kg)  02/23/17 132 lb (59.9 kg)    Physical Exam  Constitutional: She appears well-developed and well-nourished. No distress.  Skin: Skin is warm and dry. Rash noted. There is erythema.     Blistering rash  on erythematous base below L breast extending to L upper back along ~T5 dermatome  Nursing note and vitals reviewed.  Results for orders placed or performed in visit on 03/16/17  [facility] MAMMOGRAPHY  Result Value Ref Range   HM Mammogram 0-4 Bi-Rad 0-4 Bi-Rad, Self Reported Normal      Assessment & Plan:   Problem List Items Addressed This Visit    Shingles - Primary    Rash most consistent with shingles. Treat with valtrex and gabapentin. Supportive care reviewed.  Discussed contact precautions as contagious. Pt will be interested in shingrix, advised to wait a few months prior to vaccine.       Relevant Medications   valACYclovir (VALTREX) 1000 MG tablet       Follow up plan: Return if symptoms worsen or fail to improve.  Ria Bush, MD

## 2017-07-25 NOTE — Patient Instructions (Addendum)
You do have shingles. Treat with antiviral sent to pharmacy.  May use gabapentin 100mg  1-3 twice daily as needed. Caution it may make you sleepy.  May continue tylenol  Shingles Shingles, which is also known as herpes zoster, is an infection that causes a painful skin rash and fluid-filled blisters. Shingles is not related to genital herpes, which is a sexually transmitted infection. Shingles only develops in people who:  Have had chickenpox.  Have received the chickenpox vaccine. (This is rare.)  What are the causes? Shingles is caused by varicella-zoster virus (VZV). This is the same virus that causes chickenpox. After exposure to VZV, the virus stays in the body in an inactive (dormant) state. Shingles develops if the virus reactivates. This can happen many years after the initial exposure to VZV. It is not known what causes this virus to reactivate. What increases the risk? People who have had chickenpox or received the chickenpox vaccine are at risk for shingles. Infection is more common in people who:  Are older than age 92.  Have a weakened defense (immune) system, such as those with HIV, AIDS, or cancer.  Are taking medicines that weaken the immune system, such as transplant medicines.  Are under great stress.  What are the signs or symptoms? Early symptoms of this condition include itching, tingling, and pain in an area on your skin. Pain may be described as burning, stabbing, or throbbing. A few days or weeks after symptoms start, a painful red rash appears, usually on one side of the body in a bandlike or beltlike pattern. The rash eventually turns into fluid-filled blisters that break open, scab over, and dry up in about 2-3 weeks. At any time during the infection, you may also develop:  A fever.  Chills.  A headache.  An upset stomach.  How is this diagnosed? This condition is diagnosed with a skin exam. Sometimes, skin or fluid samples are taken from the blisters  before a diagnosis is made. These samples are examined under a microscope or sent to a lab for testing. How is this treated? There is no specific cure for this condition. Your health care provider will probably prescribe medicines to help you manage pain, recover more quickly, and avoid long-term problems. Medicines may include:  Antiviral drugs.  Anti-inflammatory drugs.  Pain medicines.  If the area involved is on your face, you may be referred to a specialist, such as an eye doctor (ophthalmologist) or an ear, nose, and throat (ENT) doctor to help you avoid eye problems, chronic pain, or disability. Follow these instructions at home: Medicines  Take medicines only as directed by your health care provider.  Apply an anti-itch or numbing cream to the affected area as directed by your health care provider. Blister and Rash Care  Take a cool bath or apply cool compresses to the area of the rash or blisters as directed by your health care provider. This may help with pain and itching.  Keep your rash covered with a loose bandage (dressing). Wear loose-fitting clothing to help ease the pain of material rubbing against the rash.  Keep your rash and blisters clean with mild soap and cool water or as directed by your health care provider.  Check your rash every day for signs of infection. These include redness, swelling, and pain that lasts or increases.  Do not pick your blisters.  Do not scratch your rash. General instructions  Rest as directed by your health care provider.  Keep all follow-up visits  as directed by your health care provider. This is important.  Until your blisters scab over, your infection can cause chickenpox in people who have never had it or been vaccinated against it. To prevent this from happening, avoid contact with other people, especially: ? Babies. ? Pregnant women. ? Children who have eczema. ? Elderly people who have transplants. ? People who have  chronic illnesses, such as leukemia or AIDS. Contact a health care provider if:  Your pain is not relieved with prescribed medicines.  Your pain does not get better after the rash heals.  Your rash looks infected. Signs of infection include redness, swelling, and pain that lasts or increases. Get help right away if:  The rash is on your face or nose.  You have facial pain, pain around your eye area, or loss of feeling on one side of your face.  You have ear pain or you have ringing in your ear.  You have loss of taste.  Your condition gets worse. This information is not intended to replace advice given to you by your health care provider. Make sure you discuss any questions you have with your health care provider. Document Released: 07/12/2005 Document Revised: 03/07/2016 Document Reviewed: 05/23/2014 Elsevier Interactive Patient Education  2018 [employer].

## 2017-07-26 HISTORY — PX: CATARACT EXTRACTION W/ INTRAOCULAR LENS IMPLANT: SHX1309

## 2017-08-16 ENCOUNTER — Other Ambulatory Visit: Payer: Self-pay | Admitting: Family Medicine

## 2017-08-16 NOTE — Telephone Encounter (Signed)
Last filled:  07/25/17, #21 Last OV:  07/25/17 Next OV:  09/09/17 - Dr. Glori Bickers pt

## 2017-08-18 NOTE — Telephone Encounter (Signed)
Denied. Rx was for shingles. Why does she need refill? Ongoing rash? We need update.

## 2017-08-18 NOTE — Telephone Encounter (Signed)
Spoke with pt asking for reason refill request.  Says she noticed some more bumps last week in a few areas, which have disappeared now.  But she was not sure if they were lingering from the initial breakout or if it was a new breakout.

## 2017-08-19 LAB — HM DIABETES EYE EXAM

## 2017-08-19 NOTE — Telephone Encounter (Signed)
Spoke with pt relaying message per Dr. G.  Pt says ok. 

## 2017-08-19 NOTE — Telephone Encounter (Signed)
Hopefully not related to shingles. Let us know if recurrence.

## 2017-08-28 ENCOUNTER — Telehealth: Payer: Self-pay | Admitting: Family Medicine

## 2017-08-28 DIAGNOSIS — Z0000 Encounter for general adult medical examination without abnormal findings: Secondary | ICD-10-CM

## 2017-08-28 DIAGNOSIS — E7800 Pure hypercholesterolemia, unspecified: Secondary | ICD-10-CM

## 2017-08-28 NOTE — Telephone Encounter (Signed)
-----   Message from Eustace Pen, LPN sent at 07/30/9468  3:31 PM EST ----- Regarding: Labs 2/8 Lab orders needed. Thank you.  Insurance:  [employer]  Medicare

## 2017-09-05 ENCOUNTER — Ambulatory Visit (INDEPENDENT_AMBULATORY_CARE_PROVIDER_SITE_OTHER): Payer: Medicare Other

## 2017-09-05 VITALS — BP 118/80 | HR 57 | Temp 97.9°F | Ht 63.5 in | Wt 135.5 lb

## 2017-09-05 DIAGNOSIS — E7800 Pure hypercholesterolemia, unspecified: Secondary | ICD-10-CM

## 2017-09-05 DIAGNOSIS — Z0000 Encounter for general adult medical examination without abnormal findings: Secondary | ICD-10-CM | POA: Diagnosis not present

## 2017-09-05 LAB — LIPID PANEL
CHOL/HDL RATIO: 3
Cholesterol: 164 mg/dL (ref 0–200)
HDL: 64.3 mg/dL (ref >39.00)
LDL Cholesterol: 88 mg/dL (ref 0–99)
NONHDL: 99.42
Triglycerides: 55 mg/dL (ref 0.0–149.0)
VLDL: 11 mg/dL (ref 0.0–40.0)

## 2017-09-05 LAB — COMPREHENSIVE METABOLIC PANEL
ALT: 16 U/L (ref 0–35)
AST: 15 U/L (ref 0–37)
Albumin: 4.4 g/dL (ref 3.5–5.2)
Alkaline Phosphatase: 80 U/L (ref 39–117)
BILIRUBIN TOTAL: 0.5 mg/dL (ref 0.2–1.2)
BUN: 15 mg/dL (ref 6–23)
CO2: 28 meq/L (ref 19–32)
Calcium: 9.5 mg/dL (ref 8.4–10.5)
Chloride: 102 mEq/L (ref 96–112)
Creatinine, Ser: 0.69 mg/dL (ref 0.40–1.20)
GFR: 89.09 mL/min (ref >60.00)
GLUCOSE: 96 mg/dL (ref 70–99)
Potassium: 4.5 mEq/L (ref 3.5–5.1)
SODIUM: 138 meq/L (ref 135–145)
TOTAL PROTEIN: 7.3 g/dL (ref 6.0–8.3)

## 2017-09-05 LAB — TSH: TSH: 1.48 u[IU]/mL (ref 0.35–4.50)

## 2017-09-05 NOTE — Patient Instructions (Signed)
[name] , Thank you for taking time to come for your Medicare Wellness Visit. I appreciate your ongoing commitment to your health goals. Please review the following plan we discussed and let me know if I can assist you in the future.   These are the goals we discussed: Goals    . Increase physical activity     SMART goal: Starting 09/05/2017, patient will exercise for 1 hr 3 days per week as tolerated and weather permits.        This is a list of the screening recommended for you and due dates:  Health Maintenance  Topic Date Due  . Colon Cancer Screening  09/19/2017  . Mammogram  03/15/2018  . Tetanus Vaccine  12/27/2021  . Flu Shot  Completed  . DEXA scan (bone density measurement)  Completed  .  Hepatitis C: One time screening is recommended by Center for Disease Control  (CDC) for  adults born from [birth year] through 1965.   Completed  . Pneumonia vaccines  Completed   Preventive Care for Adults  A healthy lifestyle and preventive care can promote health and wellness. Preventive health guidelines for adults include the following key practices.  . A routine yearly physical is a good way to check with your health care provider about your health and preventive screening. It is a chance to share any concerns and updates on your health and to receive a thorough exam.  . Visit your dentist for a routine exam and preventive care every 6 months. Brush your teeth twice a day and floss once a day. Good oral hygiene prevents tooth decay and gum disease.  . The frequency of eye exams is based on your age, health, family medical history, use  of contact lenses, and other factors. Follow your health care provider's recommendations for frequency of eye exams.  . Eat a healthy diet. Foods like vegetables, fruits, whole grains, low-fat dairy products, and lean protein foods contain the nutrients you need without too many calories. Decrease your intake of foods high in solid fats, added sugars, and  salt. Eat the right amount of calories for you. Get information about a proper diet from your health care provider, if necessary.  . Regular physical exercise is one of the most important things you can do for your health. Most adults should get at least 150 minutes of moderate-intensity exercise (any activity that increases your heart rate and causes you to sweat) each week. In addition, most adults need muscle-strengthening exercises on 2 or more days a week.  Silver Sneakers may be a benefit available to you. To determine eligibility, you may visit the website: www.silversneakers.com or contact program at 319-292-3815 Mon-Fri between 8AM-8PM.   . Maintain a healthy weight. The body mass index (BMI) is a screening tool to identify possible weight problems. It provides an estimate of body fat based on height and weight. Your health care provider can find your BMI and can help you achieve or maintain a healthy weight.   For adults 20 years and older: ? A BMI below 18.5 is considered underweight. ? A BMI of 18.5 to 24.9 is normal. ? A BMI of 25 to 29.9 is considered overweight. ? A BMI of 30 and above is considered obese.   . Maintain normal blood lipids and cholesterol levels by exercising and minimizing your intake of saturated fat. Eat a balanced diet with plenty of fruit and vegetables. Blood tests for lipids and cholesterol should begin at age 27 and  be repeated every 5 years. If your lipid or cholesterol levels are high, you are over 50, or you are at high risk for heart disease, you may need your cholesterol levels checked more frequently. Ongoing high lipid and cholesterol levels should be treated with medicines if diet and exercise are not working.  . If you smoke, find out from your health care provider how to quit. If you do not use tobacco, please do not start.  . If you choose to drink alcohol, please do not consume more than 2 drinks per day. One drink is considered to be 12 ounces  (355 mL) of beer, 5 ounces (148 mL) of wine, or 1.5 ounces (44 mL) of liquor.  . If you are 48-33 years old, ask your health care provider if you should take aspirin to prevent strokes.  . Use sunscreen. Apply sunscreen liberally and repeatedly throughout the day. You should seek shade when your shadow is shorter than you. Protect yourself by wearing long sleeves, pants, a wide-brimmed hat, and sunglasses year round, whenever you are outdoors.  . Once a month, do a whole body skin exam, using a mirror to look at the skin on your back. Tell your health care provider of new moles, moles that have irregular borders, moles that are larger than a pencil eraser, or moles that have changed in shape or color.

## 2017-09-05 NOTE — Progress Notes (Signed)
Subjective:   Chloe Ali is a 72 y.o. female who presents for Medicare Annual (Subsequent) preventive examination.  Review of Systems:  N/A Cardiac Risk Factors include: advanced age (>36men, >60 women);dyslipidemia     Objective:     Vitals: BP 118/80 (BP Location: Right Arm, Patient Position: Sitting, Cuff Size: Normal)   Pulse (!) 57   Temp 97.9 F (36.6 C) (Oral)   Ht 5' 3.5" (1.613 m) Comment: no shoes  Wt 135 lb 8 oz (61.5 kg)   SpO2 97%   BMI 23.63 kg/m   Body mass index is 23.63 kg/m.  Advanced Directives 09/05/2017 09/03/2016 09/03/2015 07/22/2015 07/08/2015  Does Patient Have a Medical Advance Directive? Yes Yes No No No  Type of Paramedic of [place];Living will [place];Living will - - -  Copy of [place] in Chart? No - copy requested No - copy requested - - -  Would patient like information on creating a medical advance directive? - - Yes - Educational materials given No - patient declined information No - patient declined information    Tobacco Social History   Tobacco Use  Smoking Status Never Smoker  Smokeless Tobacco Never Used     Counseling given: No   Clinical Intake:  Pre-visit preparation completed: Yes  Pain : No/denies pain Pain Score: 0-No pain     Nutritional Status: BMI of 19-24  Normal Nutritional Risks: None Diabetes: No  How often do you need to have someone help you when you read instructions, pamphlets, or other written materials from your doctor or pharmacy?: 1 - Never What is the last grade level you completed in school?: 9th grade  Interpreter Needed?: No  Comments: pt lives with spouse Information entered by :: LPinson, LPN  Past Medical History:  Diagnosis Date  . Hay fever   . History of chicken pox   . HLD (hyperlipidemia)    Past Surgical History:  Procedure Laterality Date  . ANTERIOR AND POSTERIOR REPAIR N/A 07/22/2015   Procedure:  ANTERIOR (CYSTOCELE) AND POSTERIOR REPAIR (RECTOCELE);  Surgeon: Bjorn Loser, MD;  Location: [facility];  Service: Urology;  Laterality: N/A;  . CATARACT EXTRACTION W/ INTRAOCULAR LENS IMPLANT Left 06/2017  . CATARACT EXTRACTION W/ INTRAOCULAR LENS IMPLANT Right 07/2017  . CYSTO N/A 07/22/2015   Procedure: CYSTO;  Surgeon: Bjorn Loser, MD;  Location: [facility];  Service: Urology;  Laterality: N/A;  . TUBAL LIGATION  1976  . VAGINAL HYSTERECTOMY N/A 07/22/2015   Procedure: HYSTERECTOMY VAGINAL;  Surgeon: Donnamae Jude, MD;  Location: [facility];  Service: Gynecology;  Laterality: N/A;  Requested 07/22/15 @ 7:30a with Dr. Rogue Bussing to follow   Family History  Problem Relation Age of Onset  . Pancreatic cancer Mother   . Diabetes Mother   . Cancer Mother        pancreatic  . Lung cancer Father   . Cancer Father        lung  . Stroke Maternal Grandfather   . Breast cancer Sister   . Cancer Sister 39       breast   Social History   Socioeconomic History  . Marital status: Married    Spouse name: None  . Number of children: 4  . Years of education: None  . Highest education level: None  Social Needs  . Financial resource strain: None  . Food insecurity - worry: None  . Food insecurity - inability: None  . Transportation  needs - medical: None  . Transportation needs - non-medical: None  Occupational History  . Occupation: Retired  Tobacco Use  . Smoking status: Never Smoker  . Smokeless tobacco: Never Used  Substance and Sexual Activity  . Alcohol use: Yes    Alcohol/week: 0.0 oz    Comment: Social  . Drug use: No  . Sexual activity: Not Currently    Birth control/protection: Post-menopausal, Surgical  Other Topics Concern  . None  Social History Narrative   Married      4 children      Husband on disability after car accident      Retired      No regular exercise             Outpatient Encounter Medications as of 09/05/2017  Medication Sig  . atorvastatin  (LIPITOR) 10 MG tablet Take 1 tablet (10 mg total) by mouth daily.  . cholecalciferol (VITAMIN D) 1000 UNITS tablet Take 1,000 Units by mouth daily.  . Multiple Vitamins-Minerals (OCUVITE PO) Take 1 tablet by mouth daily.  . Omega-3 Fatty Acids (FISH OIL PO) Take 1 capsule by mouth daily.  Marland Kitchen gabapentin (NEURONTIN) 100 MG capsule Take 1-3 capsules (100-300 mg total) by mouth 2 (two) times daily as needed. (Patient not taking: Reported on 09/05/2017)  . [DISCONTINUED] valACYclovir (VALTREX) 1000 MG tablet Take 1 tablet (1,000 mg total) by mouth 3 (three) times daily.   No facility-administered encounter medications on file as of 09/05/2017.     Activities of Daily Living In your present state of health, do you have any difficulty performing the following activities: 09/05/2017  Hearing? N  Vision? N  Difficulty concentrating or making decisions? N  Walking or climbing stairs? N  Dressing or bathing? N  Doing errands, shopping? N  Preparing Food and eating ? N  Using the Toilet? N  In the past six months, have you accidently leaked urine? N  Do you have problems with loss of bowel control? N  Managing your Medications? N  Managing your Finances? N  Housekeeping or managing your Housekeeping? N  Some recent data might be hidden    Patient Care Team: Tower, Wynelle Fanny, MD as PCP - General Clent Jacks, MD as Consulting Physician (Ophthalmology)    Assessment:   This is a routine wellness examination for [place].   Hearing Screening   125Hz  250Hz  500Hz  1000Hz  2000Hz  3000Hz  4000Hz  6000Hz  8000Hz   Right ear:   40 40 40  0    Left ear:   40 40 40  0    Vision Screening Comments: Last vision exam in Jan 2019 with Dr. Katy Fitch    Exercise Activities and Dietary recommendations Current Exercise Habits: The patient does not participate in regular exercise at present, Exercise limited by: None identified  Goals    . Increase physical activity     SMART goal: Starting 09/05/2017, patient will  exercise for 1 hr 3 days per week as tolerated and weather permits.        Fall Risk Fall Risk  09/05/2017 09/03/2016 09/03/2015 06/24/2014 09/04/2012  Falls in the past year? No No No No No   Depression Screen PHQ 2/9 Scores 09/05/2017 09/03/2016 09/03/2015 06/24/2014  PHQ - 2 Score 0 0 0 0  PHQ- 9 Score 0 - - -     Cognitive Function MMSE - Mini Mental State Exam 09/05/2017 09/05/2017 09/03/2016  Orientation to time 5 5 5   Orientation to Place 5 5 5   Registration 3  3 3  Attention/ Calculation 0 0 0  Recall 2 3 3   Recall-comments unable to recall 1 of 3 words - -  Language- name 2 objects 0 0 0  Language- repeat 1 1 1   Language- follow 3 step command 3 3 3   Language- read & follow direction 0 0 0  Write a sentence 0 0 0  Copy design 0 0 0  Total score 19 20 20      PLEASE NOTE: A Mini-Cog screen was completed. Maximum score is 20. A value of 0 denotes this part of [place] MMSE was not completed or the patient failed this part of the Mini-Cog screening.   Mini-Cog Screening Orientation to Time - Max 5 pts Orientation to Place - Max 5 pts Registration - Max 3 pts Recall - Max 3 pts Language Repeat - Max 1 pts Language Follow 3 Step Command - Max 3 pts     Immunization History  Administered Date(s) Administered  . Influenza Split 07/29/2011, 06/15/2012  . Influenza, High Dose Seasonal PF 05/02/2017  . Influenza,inj,Quad PF,6+ Mos 06/24/2014, 09/03/2015, 06/10/2016  . Pneumococcal Conjugate-13 06/24/2014  . Pneumococcal Polysaccharide-23 12/28/2011  . Td 12/28/2011   Screening Tests Health Maintenance  Topic Date Due  . COLONOSCOPY  09/19/2017  . MAMMOGRAM  03/15/2018  . TETANUS/TDAP  12/27/2021  . INFLUENZA VACCINE  Completed  . DEXA SCAN  Completed  . Hepatitis C Screening  Completed  . PNA vac Low Risk Adult  Completed       Plan:     I have personally reviewed, addressed, and noted the following in the patient's chart:  A. Medical and social history B. Use of  alcohol, tobacco or illicit drugs  C. Current medications and supplements D. Functional ability and status E.  Nutritional status F.  Physical activity G. Advance directives H. List of other physicians I.  Hospitalizations, surgeries, and ER visits in previous 12 months J.  Meta to include hearing, vision, cognitive, depression L. Referrals and appointments - none  In addition, I have reviewed and discussed with patient certain preventive protocols, quality metrics, and best practice recommendations. A written personalized care plan for preventive services as well as general preventive health recommendations were provided to patient.  See attached scanned questionnaire for additional information.   Signed,   Lindell Noe, MHA, BS, LPN Health Coach

## 2017-09-05 NOTE — Progress Notes (Signed)
Pre visit review using our clinic review tool, if applicable. No additional management support is needed unless otherwise documented below in the visit note. 

## 2017-09-05 NOTE — Progress Notes (Signed)
PCP notes:   Health maintenance:  Colonoscopy - addressed  Abnormal screenings:   Mini-Cog score: 19/20 MMSE - Mini Mental State Exam 09/05/2017 09/05/2017 09/03/2016  Orientation to time 5 5 5   Orientation to Place 5 5 5   Registration 3 3 3   Attention/ Calculation 0 0 0  Recall 2 3 3   Recall-comments unable to recall 1 of 3 words - -  Language- name 2 objects 0 0 0  Language- repeat 1 1 1   Language- follow 3 step command 3 3 3   Language- read & follow direction 0 0 0  Write a sentence 0 0 0  Copy design 0 0 0  Total score 19 20 20      Hearing - failed  Hearing Screening   125Hz  250Hz  500Hz  1000Hz  2000Hz  3000Hz  4000Hz  6000Hz  8000Hz   Right ear:   40 40 40  0    Left ear:   40 40 40  0     Patient concerns:   None  Nurse concerns:  None  Next PCP appt:   09/09/17 @ 1130   I reviewed health advisor's note, was available for consultation, and agree with documentation and plan. Loura Pardon MD

## 2017-09-06 LAB — CBC WITH DIFFERENTIAL/PLATELET
BASOS PCT: 0.7 %
Basophils Absolute: 49 cells/uL (ref 0–200)
Eosinophils Absolute: 161 cells/uL (ref 15–500)
Eosinophils Relative: 2.3 %
HCT: 39.9 % (ref 35.0–45.0)
Hemoglobin: 13.7 g/dL (ref 11.7–15.5)
Lymphs Abs: 2772 cells/uL (ref 850–3900)
MCH: 31.6 pg (ref 27.0–33.0)
MCHC: 34.3 g/dL (ref 32.0–36.0)
MCV: 91.9 fL (ref 80.0–100.0)
MPV: 9 fL (ref 7.5–12.5)
Monocytes Relative: 6.4 %
NEUTROS PCT: 51 %
Neutro Abs: 3570 cells/uL (ref 1500–7800)
PLATELETS: 270 10*3/uL (ref 140–400)
RBC: 4.34 10*6/uL (ref 3.80–5.10)
RDW: 12.6 % (ref 11.0–15.0)
TOTAL LYMPHOCYTE: 39.6 %
WBC: 7 10*3/uL (ref 3.8–10.8)
WBCMIX: 448 {cells}/uL (ref 200–950)

## 2017-09-09 ENCOUNTER — Encounter: Payer: Self-pay | Admitting: Gastroenterology

## 2017-09-09 ENCOUNTER — Encounter: Payer: Self-pay | Admitting: Family Medicine

## 2017-09-09 ENCOUNTER — Ambulatory Visit (INDEPENDENT_AMBULATORY_CARE_PROVIDER_SITE_OTHER): Payer: Medicare Other | Admitting: Family Medicine

## 2017-09-09 VITALS — BP 112/62 | HR 89 | Temp 97.8°F | Ht 63.5 in | Wt 136.8 lb

## 2017-09-09 DIAGNOSIS — M8589 Other specified disorders of bone density and structure, multiple sites: Secondary | ICD-10-CM | POA: Diagnosis not present

## 2017-09-09 DIAGNOSIS — Z1211 Encounter for screening for malignant neoplasm of colon: Secondary | ICD-10-CM | POA: Insufficient documentation

## 2017-09-09 DIAGNOSIS — E7800 Pure hypercholesterolemia, unspecified: Secondary | ICD-10-CM

## 2017-09-09 DIAGNOSIS — Z0000 Encounter for general adult medical examination without abnormal findings: Secondary | ICD-10-CM

## 2017-09-09 DIAGNOSIS — Z8619 Personal history of other infectious and parasitic diseases: Secondary | ICD-10-CM | POA: Diagnosis not present

## 2017-09-09 MED ORDER — ATORVASTATIN CALCIUM 10 MG PO TABS: 10.0000 mg | ORAL_TABLET | Freq: Every day | ORAL | 3 refills | Status: DC

## 2017-09-09 NOTE — Assessment & Plan Note (Signed)
Much improved with low dose atorvastatin  Disc goals for lipids and reasons to control them Rev labs with pt Rev low sat fat diet in detail Great health habits Medicine refilled -tolerating well

## 2017-09-09 NOTE — Assessment & Plan Note (Signed)
Rev dexa from 2/18  Stable  Disc need for calcium/ vitamin D/ wt bearing exercise and bone density test every 2 y to monitor Disc safety/ fracture risk in detail   No falls or fractures

## 2017-09-09 NOTE — Patient Instructions (Addendum)
If you are interested in the new shingles vaccine (Shingrix) - call your local pharmacy to check on coverage and availability  Go ahead and get on a wait list for that if you can   We will refer you for screening colonoscopy     keep up the good work with healthy diet and exercise   I refilled your cholesterol medicine

## 2017-09-09 NOTE — Assessment & Plan Note (Addendum)
Reviewed health habits including diet and exercise and skin cancer prevention Reviewed appropriate screening tests for age  Also reviewed health mt list, fam hx and immunization status , as well as social and family history   See HPI Labs reviewed  amw reviewed-no hearing c/o  Disc short term memory-no concerns/ disc need for socialization and brain exercise (which she does)  Continue physical exercise also  Disc osteopenia and fall prev  Refer for screening colonoscopy

## 2017-09-09 NOTE — Progress Notes (Signed)
Subjective:    Patient ID: Chloe Ali, female    DOB: [birth year], 72 y.o.   MRN: [id number]  HPI  Here for health maintenance exam and to review chronic medical problems     Had amw on 09/05/17 Mini cog 19/20  Missed one of 3 word recall (no problems at home)  Does enjoy socialization  Hearing screen- missed 4000 Hz both ears - does not notice it  No care gaps   Had zoster in December  Is better now    Wt Readings from Last 3 Encounters:  09/09/17 136 lb 12.8 oz (62.1 kg)  09/05/17 135 lb 8 oz (61.5 kg)  07/25/17 135 lb (61.2 kg)  does well mt her weight  Active and continues to eat healthy (cottage cz/ yogurt and fruit)  Lentils for protein  23.85 kg/m   Still caregiver for a lady with alzheimer's Climbs stairs a lot     Mammogram 8/18 normal Self breast exam -no lumps or changes   Gyn care  Hysterectomy in 12/16  Colonoscopy 2/09 - 10 y recall Wants to set that up   dexa 8/18  osteopenia , fairly stable  No falls or fractures  Good about her ca and D  Getting lots of exercise also   BP Readings from Last 3 Encounters:  09/09/17 112/62  09/05/17 118/80  07/25/17 124/76   Hyperlipidemia Lab Results  Component Value Date   CHOL 164 09/05/2017   CHOL 161 11/03/2016   CHOL 273 (H) 09/03/2016   Lab Results  Component Value Date   HDL 64.30 09/05/2017   HDL 59.60 11/03/2016   HDL 55.60 09/03/2016   Lab Results  Component Value Date   LDLCALC 88 09/05/2017   LDLCALC 77 11/03/2016   LDLCALC 178 (H) 09/03/2016   Lab Results  Component Value Date   TRIG 55.0 09/05/2017   TRIG 119.0 11/03/2016   TRIG 196.0 (H) 09/03/2016   Lab Results  Component Value Date   CHOLHDL 3 09/05/2017   CHOLHDL 3 11/03/2016   CHOLHDL 5 09/03/2016   Lab Results  Component Value Date   LDLDIRECT 143.0 08/27/2015   LDLDIRECT 149.7 01/02/2013   LDLDIRECT 140.7 12/21/2011   Huge improvement with atorvastatin   Other labs Results for orders placed or  performed in visit on 09/05/17  TSH  Result Value Ref Range   TSH 1.48 0.35 - 4.50 uIU/mL  Lipid panel  Result Value Ref Range   Cholesterol 164 0 - 200 mg/dL   Triglycerides 55.0 0.0 - 149.0 mg/dL   HDL 64.30 >39.00 mg/dL   VLDL 11.0 0.0 - 40.0 mg/dL   LDL Cholesterol 88 0 - 99 mg/dL   Total CHOL/HDL Ratio 3    NonHDL 99.42   Comprehensive metabolic panel  Result Value Ref Range   Sodium 138 135 - 145 mEq/L   Potassium 4.5 3.5 - 5.1 mEq/L   Chloride 102 96 - 112 mEq/L   CO2 28 19 - 32 mEq/L   Glucose, Bld 96 70 - 99 mg/dL   BUN 15 6 - 23 mg/dL   Creatinine, Ser 0.69 0.40 - 1.20 mg/dL   Total Bilirubin 0.5 0.2 - 1.2 mg/dL   Alkaline Phosphatase 80 39 - 117 U/L   AST 15 0 - 37 U/L   ALT 16 0 - 35 U/L   Total Protein 7.3 6.0 - 8.3 g/dL   Albumin 4.4 3.5 - 5.2 g/dL   Calcium 9.5 8.4 - 10.5 mg/dL  GFR 89.09 >60.00 mL/min  CBC with Differential/Platelet  Result Value Ref Range   WBC 7.0 3.8 - 10.8 Thousand/uL   RBC 4.34 3.80 - 5.10 Million/uL   Hemoglobin 13.7 11.7 - 15.5 g/dL   HCT 39.9 35.0 - 45.0 %   MCV 91.9 80.0 - 100.0 fL   MCH 31.6 27.0 - 33.0 pg   MCHC 34.3 32.0 - 36.0 g/dL   RDW 12.6 11.0 - 15.0 %   Platelets 270 140 - 400 Thousand/uL   MPV 9.0 7.5 - 12.5 fL   Neutro Abs 3,570 1,500 - 7,800 cells/uL   Lymphs Abs 2,772 850 - 3,900 cells/uL   WBC mixed population 448 200 - 950 cells/uL   Eosinophils Absolute 161 15 - 500 cells/uL   Basophils Absolute 49 0 - 200 cells/uL   Neutrophils Relative % 51 %   Total Lymphocyte 39.6 %   Monocytes Relative 6.4 %   Eosinophils Relative 2.3 %   Basophils Relative 0.7 %    Patient Active Problem List   Diagnosis Date Noted  . Colon cancer screening 09/09/2017  . History of shingles 07/25/2017  . Routine general medical examination at a health care facility 08/26/2015  . Osteopenia 08/16/2014  . Estrogen deficiency 06/24/2014  . Irritable bowel syndrome 09/04/2012  . Eczema 12/28/2011  . ALLERGIC RHINITIS CAUSE  UNSPECIFIED 03/26/2010  . HYPERCHOLESTEROLEMIA 08/23/2007   Past Medical History:  Diagnosis Date  . Hay fever   . History of chicken pox   . HLD (hyperlipidemia)    Past Surgical History:  Procedure Laterality Date  . ANTERIOR AND POSTERIOR REPAIR N/A 07/22/2015   Procedure: ANTERIOR (CYSTOCELE) AND POSTERIOR REPAIR (RECTOCELE);  Surgeon: Bjorn Loser, MD;  Location: [facility];  Service: Urology;  Laterality: N/A;  . CATARACT EXTRACTION W/ INTRAOCULAR LENS IMPLANT Left 06/2017  . CATARACT EXTRACTION W/ INTRAOCULAR LENS IMPLANT Right 07/2017  . CYSTO N/A 07/22/2015   Procedure: CYSTO;  Surgeon: Bjorn Loser, MD;  Location: [facility];  Service: Urology;  Laterality: N/A;  . TUBAL LIGATION  1976  . VAGINAL HYSTERECTOMY N/A 07/22/2015   Procedure: HYSTERECTOMY VAGINAL;  Surgeon: Donnamae Jude, MD;  Location: [facility];  Service: Gynecology;  Laterality: N/A;  Requested 07/22/15 @ 7:30a with Dr. Rogue Bussing to follow   Social History   Tobacco Use  . Smoking status: Never Smoker  . Smokeless tobacco: Never Used  Substance Use Topics  . Alcohol use: Yes    Alcohol/week: 1.8 - 3.0 oz    Types: 3 - 5 Glasses of wine per week  . Drug use: No   Family History  Problem Relation Age of Onset  . Pancreatic cancer Mother   . Diabetes Mother   . Cancer Mother        pancreatic  . Lung cancer Father   . Cancer Father        lung  . Stroke Maternal Grandfather   . Breast cancer Sister   . Cancer Sister 8       breast   No Known Allergies Current Outpatient Medications on File Prior to Visit  Medication Sig Dispense Refill  . cholecalciferol (VITAMIN D) 1000 UNITS tablet Take 1,000 Units by mouth daily.    Marland Kitchen gabapentin (NEURONTIN) 100 MG capsule Take 1-3 capsules (100-300 mg total) by mouth 2 (two) times daily as needed. 60 capsule 0  . Multiple Vitamins-Minerals (OCUVITE PO) Take 1 tablet by mouth daily.    . Omega-3 Fatty Acids (FISH OIL PO) Take  1 capsule by mouth daily.     No  current facility-administered medications on file prior to visit.     Review of Systems  Constitutional: Negative for activity change, appetite change, fatigue, fever and unexpected weight change.  HENT: Negative for congestion, ear pain, rhinorrhea, sinus pressure and sore throat.   Eyes: Negative for pain, redness and visual disturbance.  Respiratory: Negative for cough, shortness of breath and wheezing.   Cardiovascular: Negative for chest pain and palpitations.  Gastrointestinal: Negative for abdominal pain, blood in stool, constipation and diarrhea.  Endocrine: Negative for polydipsia and polyuria.  Genitourinary: Negative for dysuria, frequency and urgency.  Musculoskeletal: Negative for arthralgias, back pain and myalgias.  Skin: Negative for pallor and rash.  Allergic/Immunologic: Negative for environmental allergies.  Neurological: Negative for dizziness, syncope and headaches.  Hematological: Negative for adenopathy. Does not bruise/bleed easily.  Psychiatric/Behavioral: Negative for decreased concentration and dysphoric mood. The patient is not nervous/anxious.        Objective:   Physical Exam  Constitutional: She appears well-developed and well-nourished. No distress.  Well appearing   HENT:  Head: Normocephalic and atraumatic.  Right Ear: External ear normal.  Left Ear: External ear normal.  Mouth/Throat: Oropharynx is clear and moist.  Eyes: Conjunctivae and EOM are normal. Pupils are equal, round, and reactive to light. No scleral icterus.  Neck: Normal range of motion. Neck supple. No JVD present. Carotid bruit is not present. No thyromegaly present.  Cardiovascular: Normal rate, regular rhythm, normal heart sounds and intact distal pulses. Exam reveals no gallop.  Pulmonary/Chest: Effort normal and breath sounds normal. No respiratory distress. She has no wheezes. She exhibits no tenderness.  Abdominal: Soft. Bowel sounds are normal. She exhibits no distension, no  abdominal bruit and no mass. There is no tenderness.  Genitourinary: No breast swelling, tenderness, discharge or bleeding.  Genitourinary Comments: Breast exam: No mass, nodules, thickening, tenderness, bulging, retraction, inflamation, nipple discharge or skin changes noted.  No axillary or clavicular LA.      Musculoskeletal: Normal range of motion. She exhibits no edema or tenderness.  Lymphadenopathy:    She has no cervical adenopathy.  Neurological: She is alert. She has normal reflexes. No cranial nerve deficit. She exhibits normal muscle tone. Coordination normal.  Skin: Skin is warm and dry. No rash noted. No erythema. No pallor.  Solar lentigines diffusely   Psychiatric: She has a normal mood and affect.  Cheerful Mentally sharp        Assessment & Plan:   Problem List Items Addressed This Visit      Musculoskeletal and Integument   Osteopenia    Rev dexa from 2/18  Stable  Disc need for calcium/ vitamin D/ wt bearing exercise and bone density test every 2 y to monitor Disc safety/ fracture risk in detail   No falls or fractures          Other   Colon cancer screening    Refer for 10 y recall screening colonoscopy      Relevant Orders   Ambulatory referral to Gastroenterology   History of shingles    Doing much better Will get on wait list for shingrix vaccine  Has gabapentin-has not needed lately       HYPERCHOLESTEROLEMIA    Much improved with low dose atorvastatin  Disc goals for lipids and reasons to control them Rev labs with pt Rev low sat fat diet in detail Great health habits Medicine refilled -tolerating well  Relevant Medications   atorvastatin (LIPITOR) 10 MG tablet   Routine general medical examination at a health care facility - Primary    Reviewed health habits including diet and exercise and skin cancer prevention Reviewed appropriate screening tests for age  Also reviewed health mt list, fam hx and immunization status , as well  as social and family history   See HPI Labs reviewed  amw reviewed-no hearing c/o  Disc short term memory-no concerns/ disc need for socialization and brain exercise (which she does)  Continue physical exercise also  Disc osteopenia and fall prev  Refer for screening colonoscopy

## 2017-09-09 NOTE — Assessment & Plan Note (Signed)
Doing much better Will get on wait list for shingrix vaccine  Has gabapentin-has not needed lately

## 2017-09-09 NOTE — Assessment & Plan Note (Signed)
Refer for 10 y recall screening colonoscopy

## 2017-09-16 DIAGNOSIS — L820 Inflamed seborrheic keratosis: Secondary | ICD-10-CM | POA: Diagnosis not present

## 2017-09-16 DIAGNOSIS — L814 Other melanin hyperpigmentation: Secondary | ICD-10-CM | POA: Diagnosis not present

## 2017-09-16 DIAGNOSIS — L821 Other seborrheic keratosis: Secondary | ICD-10-CM | POA: Diagnosis not present

## 2017-09-16 DIAGNOSIS — L578 Other skin changes due to chronic exposure to nonionizing radiation: Secondary | ICD-10-CM | POA: Diagnosis not present

## 2017-10-07 DIAGNOSIS — L570 Actinic keratosis: Secondary | ICD-10-CM | POA: Diagnosis not present

## 2017-10-24 ENCOUNTER — Other Ambulatory Visit: Payer: Self-pay

## 2017-10-24 ENCOUNTER — Encounter: Payer: Self-pay | Admitting: Gastroenterology

## 2017-10-24 ENCOUNTER — Ambulatory Visit (AMBULATORY_SURGERY_CENTER): Payer: Self-pay | Admitting: *Deleted

## 2017-10-24 VITALS — Ht 63.5 in | Wt 141.0 lb

## 2017-10-24 DIAGNOSIS — Z1211 Encounter for screening for malignant neoplasm of colon: Secondary | ICD-10-CM

## 2017-10-24 MED ORDER — NA SULFATE-K SULFATE-MG SULF 17.5-3.13-1.6 GM/177ML PO SOLN: ORAL | 0 refills | Status: DC

## 2017-10-24 NOTE — Progress Notes (Signed)
Patient denies any allergies to eggs or soy. Patient denies any problems with anesthesia/sedation. Patient denies any oxygen use at home. Patient denies taking any diet/weight loss medications or blood thinners. EMMI education declined by pt during PV today.

## 2017-11-07 ENCOUNTER — Other Ambulatory Visit: Payer: Self-pay

## 2017-11-07 ENCOUNTER — Encounter: Payer: Self-pay | Admitting: Gastroenterology

## 2017-11-07 ENCOUNTER — Ambulatory Visit (AMBULATORY_SURGERY_CENTER): Payer: Medicare Other | Admitting: Gastroenterology

## 2017-11-07 VITALS — BP 128/66 | HR 57 | Temp 98.0°F | Resp 18 | Ht 63.5 in | Wt 141.0 lb

## 2017-11-07 DIAGNOSIS — Z1211 Encounter for screening for malignant neoplasm of colon: Secondary | ICD-10-CM

## 2017-11-07 DIAGNOSIS — Z1212 Encounter for screening for malignant neoplasm of rectum: Secondary | ICD-10-CM | POA: Diagnosis not present

## 2017-11-07 MED ORDER — SODIUM CHLORIDE 0.9 % IV SOLN: 500.0000 mL | Freq: Once | INTRAVENOUS | Status: DC

## 2017-11-07 NOTE — Op Note (Signed)
[facility] Patient Name: Chloe Ali Procedure Date: 11/07/2017 8:02 AM MRN: [id number] Endoscopist: Ladene Artist , MD Age: 72 Referring MD:  Date of Birth: 1945/11/19 Gender: Female Account #: [id number] Procedure:                Colonoscopy Indications:              Screening for colorectal malignant neoplasm Medicines:                Monitored Anesthesia Care Procedure:                Pre-Anesthesia Assessment:                           - Prior to the procedure, a History and Physical                            was performed, and patient medications and                            allergies were reviewed. The patient's tolerance of                            previous anesthesia was also reviewed. The risks                            and benefits of the procedure and the sedation                            options and risks were discussed with the patient.                            All questions were answered, and informed consent                            was obtained. Prior Anticoagulants: The patient has                            taken no previous anticoagulant or antiplatelet                            agents. ASA Grade Assessment: II - A patient with                            mild systemic disease. After reviewing the risks                            and benefits, the patient was deemed in                            satisfactory condition to undergo the procedure.                           After obtaining informed consent, the colonoscope  was passed under direct vision. Throughout the                            procedure, the patient's blood pressure, pulse, and                            oxygen saturations were monitored continuously. The                            Model PCF-[id number]) scope was introduced                            through the anus and advanced to the the cecum,                            identified by  appendiceal orifice and ileocecal                            valve. The ileocecal valve, appendiceal orifice,                            and rectum were photographed. The quality of the                            bowel preparation was good. The colonoscopy was                            performed without difficulty. The patient tolerated                            the procedure well. Scope In: 8:18:22 AM Scope Out: 8:33:21 AM Scope Withdrawal Time: 0 hours 10 minutes 54 seconds  Total Procedure Duration: 0 hours 14 minutes 59 seconds  Findings:                 The perianal and digital rectal examinations were                            normal.                           A diffuse area of moderate melanosis was found in                            the entire colon.                           The exam was otherwise without abnormality on                            direct and retroflexion views. Complications:            No immediate complications. Estimated blood loss:                            None. Estimated Blood Loss:  Estimated blood loss: none. Impression:               - Melanosis in the colon.                           - The examination was otherwise normal on direct                            and retroflexion views.                           - No specimens collected. Recommendation:           - Patient has a contact number available for                            emergencies. The signs and symptoms of potential                            delayed complications were discussed with the                            patient. Return to normal activities tomorrow.                            Written discharge instructions were provided to the                            patient.                           - Resume previous diet.                           - Continue present medications.                           - No repeat colonoscopy due to age. Ladene Artist, MD 11/07/2017 8:36:01  AM This report has been signed electronically.

## 2017-11-07 NOTE — Progress Notes (Signed)
Pt's states no medical or surgical changes since previsit or office visit. 

## 2017-11-07 NOTE — Progress Notes (Signed)
To PACU, VSS. Report to [email or web address] 

## 2017-11-07 NOTE — Patient Instructions (Signed)
YOU HAD AN ENDOSCOPIC PROCEDURE TODAY AT THE [facility]:   Refer to the procedure report that was given to you for any specific questions about what was found during the examination.  If the procedure report does not answer your questions, please call your gastroenterologist to clarify.  If you requested that your care partner not be given the details of your procedure findings, then the procedure report has been included in a sealed envelope for you to review at your convenience later.  YOU SHOULD EXPECT: Some feelings of bloating in the abdomen. Passage of more gas than usual.  Walking can help get rid of the air that was put into your GI tract during the procedure and reduce the bloating. If you had a lower endoscopy (such as a colonoscopy or flexible sigmoidoscopy) you may notice spotting of blood in your stool or on the toilet paper. If you underwent a bowel prep for your procedure, you may not have a normal bowel movement for a few days.  Please Note:  You might notice some irritation and congestion in your nose or some drainage.  This is from the oxygen used during your procedure.  There is no need for concern and it should clear up in a day or so.  SYMPTOMS TO REPORT IMMEDIATELY:   Following lower endoscopy (colonoscopy or flexible sigmoidoscopy):  Excessive amounts of blood in the stool  Significant tenderness or worsening of abdominal pains  Swelling of the abdomen that is new, acute  Fever of 100F or higher  For urgent or emergent issues, a gastroenterologist can be reached at any hour by calling [phone].   DIET:  We do recommend a small meal at first, but then you may proceed to your regular diet.  Drink plenty of fluids but you should avoid alcoholic beverages for 24 hours.  ACTIVITY:  You should plan to take it easy for the rest of today and you should NOT DRIVE or use heavy machinery until tomorrow (because of the sedation medicines used during the test).     FOLLOW UP: Our staff will call the number listed on your records the next business day following your procedure to check on you and address any questions or concerns that you may have regarding the information given to you following your procedure. If we do not reach you, we will leave a message.  However, if you are feeling well and you are not experiencing any problems, there is no need to return our call.  We will assume that you have returned to your regular daily activities without incident.  If any biopsies were taken you will be contacted by phone or by letter within the next 1-3 weeks.  Please call us at [phone] if you have not heard about the biopsies in 3 weeks.    SIGNATURES/CONFIDENTIALITY: You and/or your care partner have signed paperwork which will be entered into your electronic medical record.  These signatures attest to the fact that that the information above on your After Visit Summary has been reviewed and is understood.  Full responsibility of the confidentiality of this discharge information lies with you and/or your care-partner. 

## 2017-11-08 ENCOUNTER — Telehealth: Payer: Self-pay

## 2017-11-08 NOTE — Telephone Encounter (Signed)
  Follow up Call-  Call back number 11/07/2017  Post procedure Call Back phone  # [phone]  Permission to leave phone message Yes  Some recent data might be hidden     Patient questions:  Do you have a fever, pain , or abdominal swelling? No. Pain Score  0 *  Have you tolerated food without any problems? Yes.    Have you been able to return to your normal activities? Yes.    Do you have any questions about your discharge instructions: Diet   No. Medications  No. Follow up visit  No.  Do you have questions or concerns about your Care? No.  Actions: * If pain score is 4 or above: No action needed, pain <4.

## 2018-01-20 DIAGNOSIS — L820 Inflamed seborrheic keratosis: Secondary | ICD-10-CM | POA: Diagnosis not present

## 2018-01-20 DIAGNOSIS — L821 Other seborrheic keratosis: Secondary | ICD-10-CM | POA: Diagnosis not present

## 2018-01-20 DIAGNOSIS — L578 Other skin changes due to chronic exposure to nonionizing radiation: Secondary | ICD-10-CM | POA: Diagnosis not present

## 2018-02-22 ENCOUNTER — Other Ambulatory Visit: Payer: Self-pay | Admitting: Family Medicine

## 2018-02-22 DIAGNOSIS — Z1231 Encounter for screening mammogram for malignant neoplasm of breast: Secondary | ICD-10-CM

## 2018-03-16 ENCOUNTER — Ambulatory Visit
Admission: RE | Admit: 2018-03-16 | Discharge: 2018-03-16 | Disposition: A | Discharge status: Home / Self Care | Payer: Medicare Other | Source: Ambulatory Visit | Attending: Family Medicine | Admitting: Family Medicine

## 2018-03-16 DIAGNOSIS — Z1231 Encounter for screening mammogram for malignant neoplasm of breast: Secondary | ICD-10-CM | POA: Diagnosis not present

## 2018-03-17 ENCOUNTER — Encounter: Payer: Self-pay | Admitting: *Deleted

## 2018-03-21 DIAGNOSIS — L578 Other skin changes due to chronic exposure to nonionizing radiation: Secondary | ICD-10-CM | POA: Diagnosis not present

## 2018-03-21 DIAGNOSIS — I788 Other diseases of capillaries: Secondary | ICD-10-CM | POA: Diagnosis not present

## 2018-03-21 DIAGNOSIS — L570 Actinic keratosis: Secondary | ICD-10-CM | POA: Diagnosis not present

## 2018-03-21 DIAGNOSIS — L820 Inflamed seborrheic keratosis: Secondary | ICD-10-CM | POA: Diagnosis not present

## 2018-05-19 DIAGNOSIS — Z23 Encounter for immunization: Secondary | ICD-10-CM | POA: Diagnosis not present

## 2018-08-23 ENCOUNTER — Ambulatory Visit (INDEPENDENT_AMBULATORY_CARE_PROVIDER_SITE_OTHER): Payer: Medicare Other | Admitting: Family Medicine

## 2018-08-23 ENCOUNTER — Encounter: Payer: Self-pay | Admitting: Family Medicine

## 2018-08-23 VITALS — BP 144/76 | HR 66 | Temp 97.7°F | Ht 63.5 in | Wt 138.5 lb

## 2018-08-23 DIAGNOSIS — F430 Acute stress reaction: Secondary | ICD-10-CM | POA: Diagnosis not present

## 2018-08-23 MED ORDER — BUSPIRONE HCL 15 MG PO TABS: 7.5000 mg | ORAL_TABLET | Freq: Two times a day (BID) | ORAL | 11 refills | Status: DC
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
## 2018-09-06 DIAGNOSIS — H43811 Vitreous degeneration, right eye: Secondary | ICD-10-CM | POA: Diagnosis not present

## 2018-09-06 DIAGNOSIS — H04123 Dry eye syndrome of bilateral lacrimal glands: Secondary | ICD-10-CM | POA: Diagnosis not present

## 2018-09-06 DIAGNOSIS — Z961 Presence of intraocular lens: Secondary | ICD-10-CM | POA: Diagnosis not present

## 2018-09-06 DIAGNOSIS — H35373 Puckering of macula, bilateral: Secondary | ICD-10-CM | POA: Diagnosis not present

## 2018-09-08 ENCOUNTER — Ambulatory Visit (INDEPENDENT_AMBULATORY_CARE_PROVIDER_SITE_OTHER): Payer: Medicare Other

## 2018-09-08 VITALS — BP 126/84 | HR 73 | Temp 98.0°F | Ht 63.5 in | Wt 135.8 lb

## 2018-09-08 DIAGNOSIS — E7800 Pure hypercholesterolemia, unspecified: Secondary | ICD-10-CM

## 2018-09-08 DIAGNOSIS — Z0000 Encounter for general adult medical examination without abnormal findings: Secondary | ICD-10-CM

## 2018-09-08 DIAGNOSIS — M8580 Other specified disorders of bone density and structure, unspecified site: Secondary | ICD-10-CM | POA: Diagnosis not present

## 2018-09-08 LAB — LIPID PANEL
Cholesterol: 150 mg/dL (ref 0–200)
HDL: 52.7 mg/dL (ref >39.00)
LDL Cholesterol: 68 mg/dL (ref 0–99)
NonHDL: 97.04
Total CHOL/HDL Ratio: 3
Triglycerides: 146 mg/dL (ref 0.0–149.0)
VLDL: 29.2 mg/dL (ref 0.0–40.0)

## 2018-09-08 LAB — COMPREHENSIVE METABOLIC PANEL
ALT: 20 U/L (ref 0–35)
AST: 20 U/L (ref 0–37)
Albumin: 4.6 g/dL (ref 3.5–5.2)
Alkaline Phosphatase: 80 U/L (ref 39–117)
BILIRUBIN TOTAL: 0.6 mg/dL (ref 0.2–1.2)
BUN: 12 mg/dL (ref 6–23)
CO2: 29 mEq/L (ref 19–32)
Calcium: 10.2 mg/dL (ref 8.4–10.5)
Chloride: 102 mEq/L (ref 96–112)
Creatinine, Ser: 0.77 mg/dL (ref 0.40–1.20)
GFR: 73.65 mL/min (ref >60.00)
Glucose, Bld: 85 mg/dL (ref 70–99)
Potassium: 4.4 mEq/L (ref 3.5–5.1)
Sodium: 138 mEq/L (ref 135–145)
Total Protein: 7.2 g/dL (ref 6.0–8.3)

## 2018-09-08 LAB — CBC WITH DIFFERENTIAL/PLATELET
BASOS ABS: 0 10*3/uL (ref 0.0–0.1)
Basophils Relative: 0.7 % (ref 0.0–3.0)
Eosinophils Absolute: 0.1 10*3/uL (ref 0.0–0.7)
Eosinophils Relative: 2.1 % (ref 0.0–5.0)
HCT: 42.1 % (ref 36.0–46.0)
Hemoglobin: 14.2 g/dL (ref 12.0–15.0)
Lymphocytes Relative: 42.7 % (ref 12.0–46.0)
Lymphs Abs: 2.9 10*3/uL (ref 0.7–4.0)
MCHC: 33.8 g/dL (ref 30.0–36.0)
MCV: 95 fl (ref 78.0–100.0)
MONOS PCT: 7.6 % (ref 3.0–12.0)
Monocytes Absolute: 0.5 10*3/uL (ref 0.1–1.0)
Neutro Abs: 3.2 10*3/uL (ref 1.4–7.7)
Neutrophils Relative %: 46.9 % (ref 43.0–77.0)
Platelets: 269 10*3/uL (ref 150.0–400.0)
RBC: 4.43 Mil/uL (ref 3.87–5.11)
RDW: 12.6 % (ref 11.5–15.5)
WBC: 6.8 10*3/uL (ref 4.0–10.5)

## 2018-09-08 LAB — TSH: TSH: 2.2 u[IU]/mL (ref 0.35–4.50)

## 2018-09-08 LAB — VITAMIN D 25 HYDROXY (VIT D DEFICIENCY, FRACTURES): VITD: 36.23 ng/mL (ref 30.00–100.00)

## 2018-09-08 NOTE — Patient Instructions (Signed)
Chloe Ali , Thank you for taking time to come for your Medicare Wellness Visit. I appreciate your ongoing commitment to your health goals. Please review the following plan we discussed and let me know if I can assist you in the future.   These are the goals we discussed: Goals    . Increase physical activity     Starting 09/08/2018, I will continue to walk for 20 minutes 6 days per week.        This is a list of the screening recommended for you and due dates:  Health Maintenance  Topic Date Due  . Mammogram  03/17/2019  . Tetanus Vaccine  12/27/2021  . Flu Shot  Completed  . DEXA scan (bone density measurement)  Completed  .  Hepatitis C: One time screening is recommended by Center for Disease Control  (CDC) for  adults born from [birth year] through 1965.   Completed  . Pneumonia vaccines  Completed   Preventive Care for Adults  A healthy lifestyle and preventive care can promote health and wellness. Preventive health guidelines for adults include the following key practices.  . A routine yearly physical is a good way to check with your health care provider about your health and preventive screening. It is a chance to share any concerns and updates on your health and to receive a thorough exam.  . Visit your dentist for a routine exam and preventive care every 6 months. Brush your teeth twice a day and floss once a day. Good oral hygiene prevents tooth decay and gum disease.  . The frequency of eye exams is based on your age, health, family medical history, use  of contact lenses, and other factors. Follow your health care provider's recommendations for frequency of eye exams.  . Eat a healthy diet. Foods like vegetables, fruits, whole grains, low-fat dairy products, and lean protein foods contain the nutrients you need without too many calories. Decrease your intake of foods high in solid fats, added sugars, and salt. Eat the right amount of calories for you. Get information about a  proper diet from your health care provider, if necessary.  . Regular physical exercise is one of the most important things you can do for your health. Most adults should get at least 150 minutes of moderate-intensity exercise (any activity that increases your heart rate and causes you to sweat) each week. In addition, most adults need muscle-strengthening exercises on 2 or more days a week.  Silver Sneakers may be a benefit available to you. To determine eligibility, you may visit the website: www.silversneakers.com or contact program at 805-570-3846 Mon-Fri between 8AM-8PM.   . Maintain a healthy weight. The body mass index (BMI) is a screening tool to identify possible weight problems. It provides an estimate of body fat based on height and weight. Your health care provider can find your BMI and can help you achieve or maintain a healthy weight.   For adults 20 years and older: ? A BMI below 18.5 is considered underweight. ? A BMI of 18.5 to 24.9 is normal. ? A BMI of 25 to 29.9 is considered overweight. ? A BMI of 30 and above is considered obese.   . Maintain normal blood lipids and cholesterol levels by exercising and minimizing your intake of saturated fat. Eat a balanced diet with plenty of fruit and vegetables. Blood tests for lipids and cholesterol should begin at age 77 and be repeated every 5 years. If your lipid or cholesterol levels are  high, you are over 50, or you are at high risk for heart disease, you may need your cholesterol levels checked more frequently. Ongoing high lipid and cholesterol levels should be treated with medicines if diet and exercise are not working.  . If you smoke, find out from your health care provider how to quit. If you do not use tobacco, please do not start.  . If you choose to drink alcohol, please do not consume more than 2 drinks per day. One drink is considered to be 12 ounces (355 mL) of beer, 5 ounces (148 mL) of wine, or 1.5 ounces (44 mL) of  liquor.  . If you are 36-54 years old, ask your health care provider if you should take aspirin to prevent strokes.  . Use sunscreen. Apply sunscreen liberally and repeatedly throughout the day. You should seek shade when your shadow is shorter than you. Protect yourself by wearing long sleeves, pants, a wide-brimmed hat, and sunglasses year round, whenever you are outdoors.  . Once a month, do a whole body skin exam, using a mirror to look at the skin on your back. Tell your health care provider of new moles, moles that have irregular borders, moles that are larger than a pencil eraser, or moles that have changed in shape or color.
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
## 2018-09-08 NOTE — Progress Notes (Signed)
PCP notes:   Health maintenance:  No gaps identified.  Abnormal screenings:   Hearing - failed  Hearing Screening   125Hz  250Hz  500Hz  1000Hz  2000Hz  3000Hz  4000Hz  6000Hz  8000Hz   Right ear:   40 40 40  0    Left ear:   40 40 40  40     Patient concerns:   Patient has to see a retina specialist in near future.   Nurse concerns:  None  Next PCP appt:   09/15/18 @ 0930  I reviewed health advisor's note, was available for consultation, and agree with documentation and plan. Loura Pardon MD

## 2018-09-14 ENCOUNTER — Ambulatory Visit: Payer: Self-pay

## 2018-09-15 ENCOUNTER — Encounter: Payer: Self-pay | Admitting: Family Medicine

## 2018-09-15 ENCOUNTER — Ambulatory Visit (INDEPENDENT_AMBULATORY_CARE_PROVIDER_SITE_OTHER): Payer: Medicare Other | Admitting: Family Medicine

## 2018-09-15 VITALS — BP 124/76 | HR 65 | Temp 97.8°F | Ht 63.5 in | Wt 137.1 lb

## 2018-09-15 DIAGNOSIS — F430 Acute stress reaction: Secondary | ICD-10-CM

## 2018-09-15 DIAGNOSIS — M8580 Other specified disorders of bone density and structure, unspecified site: Secondary | ICD-10-CM

## 2018-09-15 DIAGNOSIS — E7800 Pure hypercholesterolemia, unspecified: Secondary | ICD-10-CM | POA: Diagnosis not present

## 2018-09-15 DIAGNOSIS — Z0000 Encounter for general adult medical examination without abnormal findings: Secondary | ICD-10-CM

## 2018-09-15 MED ORDER — ATORVASTATIN CALCIUM 10 MG PO TABS: 10.0000 mg | ORAL_TABLET | Freq: Every day | ORAL | 3 refills | Status: DC
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
## 2018-09-15 NOTE — Assessment & Plan Note (Signed)
Disc goals for lipids and reasons to control them Rev last labs with pt Rev low sat fat diet in detail Continues atovastatin and diet  LDL is improved further

## 2018-09-15 NOTE — Assessment & Plan Note (Signed)
dexa is due aug or later  Was stable last check No falls or fx D level in mid 30s  Enc ca and D regularly

## 2018-09-15 NOTE — Assessment & Plan Note (Signed)
Reviewed health habits including diet and exercise and skin cancer prevention Reviewed appropriate screening tests for age  Also reviewed health mt list, fam hx and immunization status , as well as social and family history   See HPI amw reviewed  Had shingrix series Mood is better  Enc continued (better) self care

## 2018-09-15 NOTE — Progress Notes (Signed)
Subjective:    Patient ID: Chloe Ali, female    DOB: 03-18-1946, 73 y.o.   MRN: [id number]  HPI Here for health maintenance exam and to review chronic medical problems   Doing well  Taking care of herself   Wt Readings from Last 3 Encounters:  09/15/18 137 lb 1 oz (62.2 kg)  09/08/18 135 lb 12.8 oz (61.6 kg)  08/23/18 138 lb 8 oz (62.8 kg)  is back to exercise (using a machine) and will get back to walking also  23.90 kg/m   amw 2/14 Missed high tone R ear on hearing screen  More issues with her eyes (just had her exam) - L eye retinal issue and will see a specialist , now using drops  Colonoscopy 4/19 No recall due to age   Mammogram 8/19  Self breast exam -no lumps or changes  Sister had breast cancer at 66 yo  dexa 8/18 -stable osteopenia Falls- none  Fractures -none  D level of 36.2  Zoster status -has had the shingrix series  Last visit disc stress reaction  Px buspar 7.5 bid - is taking a little less and it really helps  Declined counseling  Her husband got back on his medicine - and his mood swings are not as bad   Cholesterol Lab Results  Component Value Date   CHOL 150 09/08/2018   CHOL 164 09/05/2017   CHOL 161 11/03/2016   Lab Results  Component Value Date   HDL 52.70 09/08/2018   HDL 64.30 09/05/2017   HDL 59.60 11/03/2016   Lab Results  Component Value Date   LDLCALC 68 09/08/2018   LDLCALC 88 09/05/2017   LDLCALC 77 11/03/2016   Lab Results  Component Value Date   TRIG 146.0 09/08/2018   TRIG 55.0 09/05/2017   TRIG 119.0 11/03/2016   Lab Results  Component Value Date   CHOLHDL 3 09/08/2018   CHOLHDL 3 09/05/2017   CHOLHDL 3 11/03/2016   Lab Results  Component Value Date   LDLDIRECT 143.0 08/27/2015   LDLDIRECT 149.7 01/02/2013   LDLDIRECT 140.7 12/21/2011   LDL is down  Is exercising now to bring HDL up more  lipitor and diet   Other labs Results for orders placed or performed in visit on 09/08/18  TSH  Result  Value Ref Range   TSH 2.20 0.35 - 4.50 uIU/mL  Lipid Panel  Result Value Ref Range   Cholesterol 150 0 - 200 mg/dL   Triglycerides 146.0 0.0 - 149.0 mg/dL   HDL 52.70 >39.00 mg/dL   VLDL 29.2 0.0 - 40.0 mg/dL   LDL Cholesterol 68 0 - 99 mg/dL   Total CHOL/HDL Ratio 3    NonHDL 97.04   Comprehensive metabolic panel  Result Value Ref Range   Sodium 138 135 - 145 mEq/L   Potassium 4.4 3.5 - 5.1 mEq/L   Chloride 102 96 - 112 mEq/L   CO2 29 19 - 32 mEq/L   Glucose, Bld 85 70 - 99 mg/dL   BUN 12 6 - 23 mg/dL   Creatinine, Ser 0.77 0.40 - 1.20 mg/dL   Total Bilirubin 0.6 0.2 - 1.2 mg/dL   Alkaline Phosphatase 80 39 - 117 U/L   AST 20 0 - 37 U/L   ALT 20 0 - 35 U/L   Total Protein 7.2 6.0 - 8.3 g/dL   Albumin 4.6 3.5 - 5.2 g/dL   Calcium 10.2 8.4 - 10.5 mg/dL   GFR 73.65 >60.00 mL/min  CBC with Differential/Platelet  Result Value Ref Range   WBC 6.8 4.0 - 10.5 K/uL   RBC 4.43 3.87 - 5.11 Mil/uL   Hemoglobin 14.2 12.0 - 15.0 g/dL   HCT 42.1 36.0 - 46.0 %   MCV 95.0 78.0 - 100.0 fl   MCHC 33.8 30.0 - 36.0 g/dL   RDW 12.6 11.5 - 15.5 %   Platelets 269.0 150.0 - 400.0 K/uL   Neutrophils Relative % 46.9 43.0 - 77.0 %   Lymphocytes Relative 42.7 12.0 - 46.0 %   Monocytes Relative 7.6 3.0 - 12.0 %   Eosinophils Relative 2.1 0.0 - 5.0 %   Basophils Relative 0.7 0.0 - 3.0 %   Neutro Abs 3.2 1.4 - 7.7 K/uL   Lymphs Abs 2.9 0.7 - 4.0 K/uL   Monocytes Absolute 0.5 0.1 - 1.0 K/uL   Eosinophils Absolute 0.1 0.0 - 0.7 K/uL   Basophils Absolute 0.0 0.0 - 0.1 K/uL  Vitamin D, 25-hydroxy  Result Value Ref Range   VITD 36.23 30.00 - 100.00 ng/mL    Patient Active Problem List   Diagnosis Date Noted  . Stress reaction 08/23/2018  . Colon cancer screening 09/09/2017  . History of shingles 07/25/2017  . Routine general medical examination at a health care facility 08/26/2015  . Osteopenia 08/16/2014  . Estrogen deficiency 06/24/2014  . Irritable bowel syndrome 09/04/2012  . Eczema  12/28/2011  . ALLERGIC RHINITIS CAUSE UNSPECIFIED 03/26/2010  . HYPERCHOLESTEROLEMIA 08/23/2007   Past Medical History:  Diagnosis Date  . Hay fever   . History of chicken pox   . HLD (hyperlipidemia)    Past Surgical History:  Procedure Laterality Date  . ABDOMINAL HYSTERECTOMY    . ANTERIOR AND POSTERIOR REPAIR N/A 07/22/2015   Procedure: ANTERIOR (CYSTOCELE) AND POSTERIOR REPAIR (RECTOCELE);  Surgeon: Bjorn Loser, MD;  Location: [facility];  Service: Urology;  Laterality: N/A;  . CATARACT EXTRACTION W/ INTRAOCULAR LENS IMPLANT Left 06/2017  . CATARACT EXTRACTION W/ INTRAOCULAR LENS IMPLANT Right 07/2017  . COLONOSCOPY  09/20/2007  . CYSTO N/A 07/22/2015   Procedure: CYSTO;  Surgeon: Bjorn Loser, MD;  Location: [facility];  Service: Urology;  Laterality: N/A;  . OOPHORECTOMY    . TUBAL LIGATION  1976  . VAGINAL HYSTERECTOMY N/A 07/22/2015   Procedure: HYSTERECTOMY VAGINAL;  Surgeon: Donnamae Jude, MD;  Location: [facility];  Service: Gynecology;  Laterality: N/A;  Requested 07/22/15 @ 7:30a with Dr. Rogue Bussing to follow   Social History   Tobacco Use  . Smoking status: Never Smoker  . Smokeless tobacco: Never Used  Substance Use Topics  . Alcohol use: Yes    Alcohol/week: 8.0 standard drinks    Types: 7 Glasses of wine, 1 Shots of liquor per week  . Drug use: No   Family History  Problem Relation Age of Onset  . Pancreatic cancer Mother   . Diabetes Mother   . Cancer Mother        pancreatic  . Lung cancer Father   . Cancer Father        lung  . Stroke Maternal Grandfather   . Breast cancer Sister   . Cancer Sister 73       breast  . Colon cancer Neg Hx    No Known Allergies Current Outpatient Medications on File Prior to Visit  Medication Sig Dispense Refill  . busPIRone (BUSPAR) 15 MG tablet Take 0.5 tablets (7.5 mg total) by mouth 2 (two) times daily. 30 tablet 11  .  cholecalciferol (VITAMIN D) 1000 UNITS tablet Take 1,000 Units by mouth daily.    Marland Kitchen ketorolac  (ACULAR) 0.4 % SOLN Place 1 drop into the left eye 4 (four) times daily.    . Multiple Vitamins-Minerals (OCUVITE PO) Take 1 tablet by mouth daily.    . Omega-3 Fatty Acids (FISH OIL PO) Take 1 capsule by mouth daily.     No current facility-administered medications on file prior to visit.      Review of Systems  Constitutional: Negative for activity change, appetite change, fatigue, fever and unexpected weight change.  HENT: Negative for congestion, ear pain, rhinorrhea, sinus pressure and sore throat.   Eyes: Negative for pain, redness and visual disturbance.  Respiratory: Negative for cough, shortness of breath and wheezing.   Cardiovascular: Negative for chest pain and palpitations.  Gastrointestinal: Negative for abdominal pain, blood in stool, constipation and diarrhea.  Endocrine: Negative for polydipsia and polyuria.  Genitourinary: Negative for dysuria, frequency and urgency.  Musculoskeletal: Negative for arthralgias, back pain and myalgias.  Skin: Negative for pallor and rash.  Allergic/Immunologic: Negative for environmental allergies.  Neurological: Negative for dizziness, syncope and headaches.  Hematological: Negative for adenopathy. Does not bruise/bleed easily.  Psychiatric/Behavioral: Negative for decreased concentration and dysphoric mood. The patient is not nervous/anxious.        Less anxious  Stressors are improved        Objective:   Physical Exam Constitutional:      General: She is not in acute distress.    Appearance: Normal appearance. She is well-developed and normal weight. She is not ill-appearing.  HENT:     Head: Normocephalic and atraumatic.     Right Ear: Tympanic membrane, ear canal and external ear normal.     Left Ear: Tympanic membrane, ear canal and external ear normal.     Nose: Nose normal. No congestion or rhinorrhea.     Mouth/Throat:     Mouth: Mucous membranes are moist.     Pharynx: Oropharynx is clear.  Eyes:     General: No  scleral icterus.    Conjunctiva/sclera: Conjunctivae normal.     Pupils: Pupils are equal, round, and reactive to light.  Neck:     Musculoskeletal: Normal range of motion and neck supple.     Thyroid: No thyromegaly.     Vascular: No carotid bruit or JVD.  Cardiovascular:     Rate and Rhythm: Normal rate and regular rhythm.     Pulses: Normal pulses.     Heart sounds: Normal heart sounds. No gallop.   Pulmonary:     Effort: Pulmonary effort is normal. No respiratory distress.     Breath sounds: Normal breath sounds. No wheezing.  Chest:     Chest wall: No tenderness.  Abdominal:     General: Bowel sounds are normal. There is no distension or abdominal bruit.     Palpations: Abdomen is soft. There is no mass.     Tenderness: There is no abdominal tenderness.  Genitourinary:    Comments: Breast exam: No mass, nodules, thickening, tenderness, bulging, retraction, inflamation, nipple discharge or skin changes noted.  No axillary or clavicular LA.     Musculoskeletal: Normal range of motion.        General: No tenderness.     Right lower leg: No edema.     Left lower leg: No edema.  Lymphadenopathy:     Cervical: No cervical adenopathy.  Skin:    General: Skin is warm and dry.  Capillary Refill: Capillary refill takes less than 2 seconds.     Coloration: Skin is not pale.     Findings: No erythema or rash.     Comments: Solar lentigines diffusely   Neurological:     Mental Status: She is alert.     Cranial Nerves: No cranial nerve deficit.     Motor: No abnormal muscle tone.     Coordination: Coordination normal.     Deep Tendon Reflexes: Reflexes are normal and symmetric. Reflexes normal.  Psychiatric:        Mood and Affect: Mood normal.           Assessment & Plan:   Problem List Items Addressed This Visit      Musculoskeletal and Integument   Osteopenia    dexa is due aug or later  Was stable last check No falls or fx D level in mid 9s  Enc ca and D  regularly        Other   HYPERCHOLESTEROLEMIA    Disc goals for lipids and reasons to control them Rev last labs with pt Rev low sat fat diet in detail Continues atovastatin and diet  LDL is improved further      Relevant Medications   atorvastatin (LIPITOR) 10 MG tablet   Routine general medical examination at a health care facility - Primary    Reviewed health habits including diet and exercise and skin cancer prevention Reviewed appropriate screening tests for age  Also reviewed health mt list, fam hx and immunization status , as well as social and family history   See HPI amw reviewed  Had shingrix series Mood is better  Enc continued (better) self care       Stress reaction    Doing quite a bit better Taking buspar Husband's behavior is a bit better now that he is back on medication  No hx of physical abuse  Continue to follow

## 2018-09-15 NOTE — Patient Instructions (Signed)
Take care of yourself  Stay active Wear sun protection  Labs look good

## 2018-09-20 DIAGNOSIS — H43812 Vitreous degeneration, left eye: Secondary | ICD-10-CM | POA: Diagnosis not present

## 2018-09-20 DIAGNOSIS — Z961 Presence of intraocular lens: Secondary | ICD-10-CM | POA: Diagnosis not present

## 2018-09-20 DIAGNOSIS — H35372 Puckering of macula, left eye: Secondary | ICD-10-CM | POA: Diagnosis not present

## 2018-09-20 DIAGNOSIS — H43811 Vitreous degeneration, right eye: Secondary | ICD-10-CM | POA: Diagnosis not present

## 2018-10-04 DIAGNOSIS — H35372 Puckering of macula, left eye: Secondary | ICD-10-CM | POA: Diagnosis not present

## 2018-10-13 DIAGNOSIS — Z09 Encounter for follow-up examination after completed treatment for conditions other than malignant neoplasm: Secondary | ICD-10-CM | POA: Diagnosis not present

## 2018-10-13 DIAGNOSIS — H35372 Puckering of macula, left eye: Secondary | ICD-10-CM | POA: Diagnosis not present

## 2018-12-04 DIAGNOSIS — H35372 Puckering of macula, left eye: Secondary | ICD-10-CM | POA: Diagnosis not present

## 2018-12-04 DIAGNOSIS — Z09 Encounter for follow-up examination after completed treatment for conditions other than malignant neoplasm: Secondary | ICD-10-CM | POA: Diagnosis not present

## 2019-04-16 DIAGNOSIS — H35372 Puckering of macula, left eye: Secondary | ICD-10-CM | POA: Diagnosis not present

## 2019-04-16 DIAGNOSIS — Z961 Presence of intraocular lens: Secondary | ICD-10-CM | POA: Diagnosis not present

## 2019-04-16 DIAGNOSIS — H43811 Vitreous degeneration, right eye: Secondary | ICD-10-CM | POA: Diagnosis not present

## 2019-04-16 DIAGNOSIS — H353131 Nonexudative age-related macular degeneration, bilateral, early dry stage: Secondary | ICD-10-CM | POA: Diagnosis not present

## 2019-04-20 DIAGNOSIS — L820 Inflamed seborrheic keratosis: Secondary | ICD-10-CM | POA: Diagnosis not present

## 2019-04-20 DIAGNOSIS — L821 Other seborrheic keratosis: Secondary | ICD-10-CM | POA: Diagnosis not present

## 2019-04-20 DIAGNOSIS — L719 Rosacea, unspecified: Secondary | ICD-10-CM | POA: Diagnosis not present

## 2019-04-20 DIAGNOSIS — L988 Other specified disorders of the skin and subcutaneous tissue: Secondary | ICD-10-CM | POA: Diagnosis not present

## 2019-05-18 ENCOUNTER — Other Ambulatory Visit: Payer: Self-pay | Admitting: Family Medicine

## 2019-05-18 DIAGNOSIS — Z1231 Encounter for screening mammogram for malignant neoplasm of breast: Secondary | ICD-10-CM

## 2019-05-30 ENCOUNTER — Encounter: Payer: Self-pay | Admitting: Family Medicine

## 2019-05-30 ENCOUNTER — Ambulatory Visit (INDEPENDENT_AMBULATORY_CARE_PROVIDER_SITE_OTHER): Payer: Medicare Other | Admitting: Family Medicine

## 2019-05-30 VITALS — BP 107/67 | Temp 97.1°F | Ht 63.5 in | Wt 135.0 lb

## 2019-05-30 DIAGNOSIS — J302 Other seasonal allergic rhinitis: Secondary | ICD-10-CM | POA: Diagnosis not present

## 2019-05-30 MED ORDER — FLUTICASONE PROPIONATE 50 MCG/ACT NA SUSP: 2.0000 | Freq: Every day | NASAL | 6 refills | Status: AC

## 2019-05-30 NOTE — Progress Notes (Signed)
Virtual Visit via Telephone Note  I connected with Chloe Ali on 05/30/19 at  2:00 PM EST by telephone and verified that I am speaking with the correct person using two identifiers.  Location: Patient: in her home Provider: [facility]   I discussed the limitations, risks, security and privacy concerns of performing an evaluation and management service by telephone and the availability of in person appointments. I also discussed with the patient that there may be a patient responsible charge related to this service. The patient expressed understanding and agreed to proceed.   History of Present Illness: Chief Complaint  Patient presents with  . Sinus Problem    Pt c/o sinus headache x several weeks, feels she is getting a sinus infection, increased sneezing. Pt states that her sinuses have been acting up since late summer. Pt has a hx of migraines. Pt using OTC sinus meds. There is a little of blood when she blows her nose - very little whenever she does a lot of blowing her nose. No discolored mucous, denies fevers, no URI sx's.    This is a 73 yo female who presents today for telephone visit for above cc.  She reports that her symptoms come and go.  She typically has seasonal allergies in the fall and spring and reports that these are worse this year.  She has a little dog that she takes out in the mornings and after being outside with her dog, she comes in the house and her stopped up and she has a headache.  She went intermittently sneeze and she has had to blow her nose frequently.  She denies any ear pain, cough, wheeze, shortness of breath or fever.  She has taken some intermittent Zyrtec without relief.  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  Past Medical History:  Diagnosis Date  . Hay fever   . History of chicken pox   . HLD (hyperlipidemia)    Past Surgical History:  Procedure Laterality Date  . ABDOMINAL HYSTERECTOMY    . ANTERIOR AND  POSTERIOR REPAIR N/A 07/22/2015   Procedure: ANTERIOR (CYSTOCELE) AND POSTERIOR REPAIR (RECTOCELE);  Surgeon: Bjorn Loser, MD;  Location: [facility];  Service: Urology;  Laterality: N/A;  . CATARACT EXTRACTION W/ INTRAOCULAR LENS IMPLANT Left 06/2017  . CATARACT EXTRACTION W/ INTRAOCULAR LENS IMPLANT Right 07/2017  . COLONOSCOPY  09/20/2007  . CYSTO N/A 07/22/2015   Procedure: CYSTO;  Surgeon: Bjorn Loser, MD;  Location: [facility];  Service: Urology;  Laterality: N/A;  . OOPHORECTOMY    . TUBAL LIGATION  1976  . VAGINAL HYSTERECTOMY N/A 07/22/2015   Procedure: HYSTERECTOMY VAGINAL;  Surgeon: Donnamae Jude, MD;  Location: [facility];  Service: Gynecology;  Laterality: N/A;  Requested 07/22/15 @ 7:30a with Dr. Rogue Bussing to follow   Family History  Problem Relation Age of Onset  . Pancreatic cancer Mother   . Diabetes Mother   . Cancer Mother        pancreatic  . Lung cancer Father   . Cancer Father        lung  . Stroke Maternal Grandfather   . Breast cancer Sister   . Cancer Sister 38       breast  . Colon cancer Neg Hx    Social History   Tobacco Use  . Smoking status: Never Smoker  . Smokeless tobacco: Never Used  Substance Use Topics  . Alcohol use: Yes    Alcohol/week: 8.0 standard  drinks    Types: 7 Glasses of wine, 1 Shots of liquor per week  . Drug use: No      Observations/Objective: The patient is alert and answers questions appropriately.  Her respirations are even and unlabored and she is able to converse in complete sentences without audible wheeze or witnessed cough. BP 107/67 Comment: unable to check BP  Temp (!) 97.1 F (36.2 C) (Temporal)   Ht 5' 3.5" (1.613 m)   Wt 135 lb (61.2 kg)   BMI 23.54 kg/m   Assessment and Plan: 1. Seasonal allergies -Agree with patient that symptoms seem likely related to seasonal allergies.  Discussed using steroid nasal spray and patient agreeable.  Prescription sent to her pharmacy. -She was instructed to use twice a  day for 3 days then use once daily. -Strict follow-up precautions reviewed with patient.  If she has worsening symptoms, new symptoms such as fever, productive cough or shortness of breath or if symptoms or not improved in 5 to 7 days. - fluticasone (FLONASE) 50 MCG/ACT nasal spray; Place 2 sprays into both nostrils daily.  Dispense: 16 g; Refill: Higgins, FNP-BC  [facility], [place]  05/30/2019 2:18 PM   Follow Up Instructions:    I discussed the assessment and treatment plan with the patient. The patient was provided an opportunity to ask questions and all were answered. The patient agreed with the plan and demonstrated an understanding of the instructions.   The patient was advised to call back or seek an in-person evaluation if the symptoms worsen or if the condition fails to improve as anticipated.  I provided 11 minutes of non-face-to-face time during this encounter.   Elby Beck, FNP

## 2019-07-10 DIAGNOSIS — L570 Actinic keratosis: Secondary | ICD-10-CM | POA: Diagnosis not present

## 2019-07-10 DIAGNOSIS — L821 Other seborrheic keratosis: Secondary | ICD-10-CM | POA: Diagnosis not present

## 2019-07-10 DIAGNOSIS — L814 Other melanin hyperpigmentation: Secondary | ICD-10-CM | POA: Diagnosis not present

## 2019-08-09 ENCOUNTER — Ambulatory Visit
Admission: RE | Admit: 2019-08-09 | Discharge: 2019-08-09 | Disposition: A | Discharge status: Home / Self Care | Payer: Medicare Other | Source: Ambulatory Visit | Attending: Family Medicine | Admitting: Family Medicine

## 2019-08-09 DIAGNOSIS — Z1231 Encounter for screening mammogram for malignant neoplasm of breast: Secondary | ICD-10-CM | POA: Diagnosis not present

## 2019-09-09 ENCOUNTER — Telehealth: Payer: Self-pay | Admitting: Family Medicine

## 2019-09-09 DIAGNOSIS — Z0000 Encounter for general adult medical examination without abnormal findings: Secondary | ICD-10-CM

## 2019-09-09 DIAGNOSIS — E7800 Pure hypercholesterolemia, unspecified: Secondary | ICD-10-CM

## 2019-09-09 NOTE — Telephone Encounter (Signed)
-----   Message from Cloyd Stagers, RT sent at 09/03/2019  1:49 PM EST ----- Regarding: Lab Orders for Monday 2.15.2021 Please place lab orders for Monday 2.15.2021, office visit for physical on Monday 2.22.2021 Thank you, Dyke Maes RT(R)

## 2019-09-10 ENCOUNTER — Other Ambulatory Visit: Payer: Medicare Other

## 2019-09-10 ENCOUNTER — Ambulatory Visit: Payer: Self-pay

## 2019-09-10 ENCOUNTER — Other Ambulatory Visit: Payer: Self-pay

## 2019-09-11 ENCOUNTER — Ambulatory Visit (INDEPENDENT_AMBULATORY_CARE_PROVIDER_SITE_OTHER): Payer: Medicare Other

## 2019-09-11 ENCOUNTER — Other Ambulatory Visit: Payer: Self-pay

## 2019-09-11 VITALS — Wt 133.0 lb

## 2019-09-11 DIAGNOSIS — Z0000 Encounter for general adult medical examination without abnormal findings: Secondary | ICD-10-CM | POA: Diagnosis not present

## 2019-09-11 NOTE — Patient Instructions (Signed)
[name] , Thank you for taking time to come for your Medicare Wellness Visit. I appreciate your ongoing commitment to your health goals. Please review the following plan we discussed and let me know if I can assist you in the future.   Screening recommendations/referrals: Colonoscopy: Up to date, completed 11/07/2017 Mammogram: Up to date, completed 08/09/2019 Bone Density:completed 03/15/2017 Recommended yearly ophthalmology/optometry visit for glaucoma screening and checkup Recommended yearly dental visit for hygiene and checkup  Vaccinations: Influenza vaccine: Up to date, completed 04/26/2019 Pneumococcal vaccine: Completed series Tdap vaccine: Up to date, completed 12/28/2011 Shingles vaccine: Completed series     Advanced directives: Advance directive discussed with you today. Even though you declined this today please call our office should you change your mind and we can give you the proper paperwork for you to fill out.  Conditions/risks identified: hypercholesterolemia  Next appointment: 09/17/2019 @ 10:45 am    Preventive Care 65 Years and Older, Female Preventive care refers to lifestyle choices and visits with your health care provider that can promote health and wellness. What does preventive care include?  A yearly physical exam. This is also called an annual well check.  Dental exams once or twice a year.  Routine eye exams. Ask your health care provider how often you should have your eyes checked.  Personal lifestyle choices, including:  Daily care of your teeth and gums.  Regular physical activity.  Eating a healthy diet.  Avoiding tobacco and drug use.  Limiting alcohol use.  Practicing safe sex.  Taking low-dose aspirin every day.  Taking vitamin and mineral supplements as recommended by your health care provider. What happens during an annual well check? The services and screenings done by your health care provider during your annual well check  will depend on your age, overall health, lifestyle risk factors, and family history of disease. Counseling  Your health care provider may ask you questions about your:  Alcohol use.  Tobacco use.  Drug use.  Emotional well-being.  Home and relationship well-being.  Sexual activity.  Eating habits.  History of falls.  Memory and ability to understand (cognition).  Work and work Statistician.  Reproductive health. Screening  You may have the following tests or measurements:  Height, weight, and BMI.  Blood pressure.  Lipid and cholesterol levels. These may be checked every 5 years, or more frequently if you are over 25 years old.  Skin check.  Lung cancer screening. You may have this screening every year starting at age 69 if you have a 30-pack-year history of smoking and currently smoke or have quit within the past 15 years.  Fecal occult blood test (FOBT) of the stool. You may have this test every year starting at age 31.  Flexible sigmoidoscopy or colonoscopy. You may have a sigmoidoscopy every 5 years or a colonoscopy every 10 years starting at age 23.  Hepatitis C blood test.  Hepatitis B blood test.  Sexually transmitted disease (STD) testing.  Diabetes screening. This is done by checking your blood sugar (glucose) after you have not eaten for a while (fasting). You may have this done every 1-3 years.  Bone density scan. This is done to screen for osteoporosis. You may have this done starting at age 19.  Mammogram. This may be done every 1-2 years. Talk to your health care provider about how often you should have regular mammograms. Talk with your health care provider about your test results, treatment options, and if necessary, the need for more  tests. Vaccines  Your health care provider may recommend certain vaccines, such as:  Influenza vaccine. This is recommended every year.  Tetanus, diphtheria, and acellular pertussis (Tdap, Td) vaccine. You may  need a Td booster every 10 years.  Zoster vaccine. You may need this after age 48.  Pneumococcal 13-valent conjugate (PCV13) vaccine. One dose is recommended after age 42.  Pneumococcal polysaccharide (PPSV23) vaccine. One dose is recommended after age 24. Talk to your health care provider about which screenings and vaccines you need and how often you need them. This information is not intended to replace advice given to you by your health care provider. Make sure you discuss any questions you have with your health care provider. Document Released: 08/08/2015 Document Revised: 03/31/2016 Document Reviewed: 05/13/2015 Elsevier Interactive Patient Education  2017 [employer] Prevention in the Home Falls can cause injuries. They can happen to people of all ages. There are many things you can do to make your home safe and to help prevent falls. What can I do on the outside of my home?  Regularly fix the edges of walkways and driveways and fix any cracks.  Remove anything that might make you trip as you walk through a door, such as a raised step or threshold.  Trim any bushes or trees on the path to your home.  Use bright outdoor lighting.  Clear any walking paths of anything that might make someone trip, such as rocks or tools.  Regularly check to see if handrails are loose or broken. Make sure that both sides of any steps have handrails.  Any raised decks and porches should have guardrails on the edges.  Have any leaves, snow, or ice cleared regularly.  Use sand or salt on walking paths during winter.  Clean up any spills in your garage right away. This includes oil or grease spills. What can I do in the bathroom?  Use night lights.  Install grab bars by the toilet and in the tub and shower. Do not use towel bars as grab bars.  Use non-skid mats or decals in the tub or shower.  If you need to sit down in the shower, use a plastic, non-slip stool.  Keep the floor  dry. Clean up any water that spills on the floor as soon as it happens.  Remove soap buildup in the tub or shower regularly.  Attach bath mats securely with double-sided non-slip rug tape.  Do not have throw rugs and other things on the floor that can make you trip. What can I do in the bedroom?  Use night lights.  Make sure that you have a light by your bed that is easy to reach.  Do not use any sheets or blankets that are too big for your bed. They should not hang down onto the floor.  Have a firm chair that has side arms. You can use this for support while you get dressed.  Do not have throw rugs and other things on the floor that can make you trip. What can I do in the kitchen?  Clean up any spills right away.  Avoid walking on wet floors.  Keep items that you use a lot in easy-to-reach places.  If you need to reach something above you, use a strong step stool that has a grab bar.  Keep electrical cords out of the way.  Do not use floor polish or wax that makes floors slippery. If you must use wax, use non-skid floor  wax.  Do not have throw rugs and other things on the floor that can make you trip. What can I do with my stairs?  Do not leave any items on the stairs.  Make sure that there are handrails on both sides of the stairs and use them. Fix handrails that are broken or loose. Make sure that handrails are as long as the stairways.  Check any carpeting to make sure that it is firmly attached to the stairs. Fix any carpet that is loose or worn.  Avoid having throw rugs at the top or bottom of the stairs. If you do have throw rugs, attach them to the floor with carpet tape.  Make sure that you have a light switch at the top of the stairs and the bottom of the stairs. If you do not have them, ask someone to add them for you. What else can I do to help prevent falls?  Wear shoes that:  Do not have high heels.  Have rubber bottoms.  Are comfortable and fit you  well.  Are closed at the toe. Do not wear sandals.  If you use a stepladder:  Make sure that it is fully opened. Do not climb a closed stepladder.  Make sure that both sides of the stepladder are locked into place.  Ask someone to hold it for you, if possible.  Clearly mark and make sure that you can see:  Any grab bars or handrails.  First and last steps.  Where the edge of each step is.  Use tools that help you move around (mobility aids) if they are needed. These include:  Canes.  Walkers.  Scooters.  Crutches.  Turn on the lights when you go into a dark area. Replace any light bulbs as soon as they burn out.  Set up your furniture so you have a clear path. Avoid moving your furniture around.  If any of your floors are uneven, fix them.  If there are any pets around you, be aware of where they are.  Review your medicines with your doctor. Some medicines can make you feel dizzy. This can increase your chance of falling. Ask your doctor what other things that you can do to help prevent falls. This information is not intended to replace advice given to you by your health care provider. Make sure you discuss any questions you have with your health care provider. Document Released: 05/08/2009 Document Revised: 12/18/2015 Document Reviewed: 08/16/2014 Elsevier Interactive Patient Education  2017 [employer].

## 2019-09-11 NOTE — Progress Notes (Signed)
Subjective:   LETASHA CASTELO is a 74 y.o. female who presents for Medicare Annual (Subsequent) preventive examination.  Review of Systems: N/A   This visit is being conducted through telemedicine via telephone at the nurse health advisor's home address due to the COVID-19 pandemic. This patient has given me verbal consent via doximity to conduct this visit, patient states they are participating from their home address. Patient and myself are on the telephone call. There is no referral for this visit. Some vital signs may be absent or patient reported.    Patient identification: identified by name, DOB, and current address   Cardiac Risk Factors include: advanced age (>63men, >48 women);Other (see comment), Risk factor comments: hypercholesterolemia     Objective:     Vitals: Wt 133 lb (60.3 kg)   BMI 23.19 kg/m   Body mass index is 23.19 kg/m.  Advanced Directives 09/11/2019 09/08/2018 11/07/2017 09/05/2017 09/03/2016 09/03/2015 07/22/2015  Does Patient Have a Medical Advance Directive? No No No Yes Yes No No  Type of Advance Directive - - Public librarian;Living will [place];Living will - -  Does patient want to make changes to medical advance directive? - No - Patient declined - - - - -  Copy of [place] in Chart? - - - No - copy requested No - copy requested - -  Would patient like information on creating a medical advance directive? No - Patient declined No - Patient declined - - - Yes - Scientist, clinical (histocompatibility and immunogenetics) given No - patient declined information    Tobacco Social History   Tobacco Use  Smoking Status Never Smoker  Smokeless Tobacco Never Used     Counseling given: Not Answered   Clinical Intake:  Pre-visit preparation completed: Yes  Pain : No/denies pain     Nutritional Risks: None Diabetes: No  How often do you need to have someone help you when you read instructions, pamphlets, or other written materials  from your doctor or pharmacy?: 1 - Never What is the last grade level you completed in school?: 9th  Interpreter Needed?: No  Information entered by :: CJohnson, LPN  Past Medical History:  Diagnosis Date  . Hay fever   . History of chicken pox   . HLD (hyperlipidemia)    Past Surgical History:  Procedure Laterality Date  . ABDOMINAL HYSTERECTOMY    . ANTERIOR AND POSTERIOR REPAIR N/A 07/22/2015   Procedure: ANTERIOR (CYSTOCELE) AND POSTERIOR REPAIR (RECTOCELE);  Surgeon: Bjorn Loser, MD;  Location: [facility];  Service: Urology;  Laterality: N/A;  . CATARACT EXTRACTION W/ INTRAOCULAR LENS IMPLANT Left 06/2017  . CATARACT EXTRACTION W/ INTRAOCULAR LENS IMPLANT Right 07/2017  . COLONOSCOPY  09/20/2007  . CYSTO N/A 07/22/2015   Procedure: CYSTO;  Surgeon: Bjorn Loser, MD;  Location: [facility];  Service: Urology;  Laterality: N/A;  . OOPHORECTOMY    . TUBAL LIGATION  1976  . VAGINAL HYSTERECTOMY N/A 07/22/2015   Procedure: HYSTERECTOMY VAGINAL;  Surgeon: Donnamae Jude, MD;  Location: [facility];  Service: Gynecology;  Laterality: N/A;  Requested 07/22/15 @ 7:30a with Dr. Rogue Bussing to follow   Family History  Problem Relation Age of Onset  . Pancreatic cancer Mother   . Diabetes Mother   . Cancer Mother        pancreatic  . Lung cancer Father   . Cancer Father        lung  . Stroke Maternal Grandfather   .  Breast cancer Sister   . Cancer Sister 38       breast  . Colon cancer Neg Hx    Social History   Socioeconomic History  . Marital status: Married    Spouse name: Not on file  . Number of children: 4  . Years of education: Not on file  . Highest education level: Not on file  Occupational History  . Occupation: Retired  Tobacco Use  . Smoking status: Never Smoker  . Smokeless tobacco: Never Used  Substance and Sexual Activity  . Alcohol use: Yes    Alcohol/week: 8.0 standard drinks    Types: 7 Glasses of wine, 1 Shots of liquor per week  . Drug use: No  .  Sexual activity: Not Currently    Birth control/protection: Post-menopausal, Surgical  Other Topics Concern  . Not on file  Social History Narrative   Married      4 children      Husband on disability after car accident      Retired      No regular exercise            Social Determinants of Health   Financial Resource Strain: Low Risk   . Difficulty of Paying Living Expenses: Not hard at all  Food Insecurity: No Food Insecurity  . Worried About Charity fundraiser in the Last Year: Never true  . Ran Out of Food in the Last Year: Never true  Transportation Needs: No Transportation Needs  . Lack of Transportation (Medical): No  . Lack of Transportation (Non-Medical): No  Physical Activity: Insufficiently Active  . Days of Exercise per Week: 4 days  . Minutes of Exercise per Session: 30 min  Stress: No Stress Concern Present  . Feeling of Stress : Not at all  Social Connections:   . Frequency of Communication with Friends and Family: Not on file  . Frequency of Social Gatherings with Friends and Family: Not on file  . Attends Religious Services: Not on file  . Active Member of Clubs or Organizations: Not on file  . Attends Archivist Meetings: Not on file  . Marital Status: Not on file    Outpatient Encounter Medications as of 09/11/2019  Medication Sig  . atorvastatin (LIPITOR) 10 MG tablet Take 1 tablet (10 mg total) by mouth daily.  . cholecalciferol (VITAMIN D) 1000 UNITS tablet Take 1,000 Units by mouth daily.  . fluticasone (FLONASE) 50 MCG/ACT nasal spray Place 2 sprays into both nostrils daily. (Patient taking differently: Place 2 sprays into both nostrils daily as needed. )  . Multiple Vitamins-Minerals (OCUVITE PO) Take 1 tablet by mouth daily.   No facility-administered encounter medications on file as of 09/11/2019.    Activities of Daily Living In your present state of health, do you have any difficulty performing the following activities:  09/11/2019  Hearing? N  Vision? N  Difficulty concentrating or making decisions? N  Walking or climbing stairs? N  Dressing or bathing? N  Doing errands, shopping? N  Preparing Food and eating ? N  Using the Toilet? N  In the past six months, have you accidently leaked urine? N  Do you have problems with loss of bowel control? N  Managing your Medications? N  Managing your Finances? N  Housekeeping or managing your Housekeeping? N  Some recent data might be hidden    Patient Care Team: Tower, Wynelle Fanny, MD as PCP - Evalina Field, MD as Consulting Physician (  Ophthalmology)    Assessment:   This is a routine wellness examination for [place].  Exercise Activities and Dietary recommendations Current Exercise Habits: Home exercise routine, Type of exercise: walking, Time (Minutes): 30, Frequency (Times/Week): 4, Weekly Exercise (Minutes/Week): 120, Intensity: Moderate, Exercise limited by: None identified  Goals    . Increase physical activity     Starting 09/08/2018, I will continue to walk for 20 minutes 6 days per week.     . Patient Stated     09/11/2019, I will continue to walk 3-4 times a week for about 30 minutes.        Fall Risk Fall Risk  09/11/2019 09/08/2018 09/05/2017 09/03/2016 09/03/2015  Falls in the past year? 0 0 No No No  Number falls in past yr: 0 - - - -  Injury with Fall? 0 - - - -  Risk for fall due to : No Fall Risks - - - -  Follow up Falls prevention discussed;Falls evaluation completed - - - -   Is the patient's home free of loose throw rugs in walkways, pet beds, electrical cords, etc?   yes      Grab bars in the bathroom? yes      Handrails on the stairs?   yes      Adequate lighting?   yes  Timed Get Up and Go performed: N/A  Depression Screen PHQ 2/9 Scores 09/11/2019 09/08/2018 08/23/2018 09/05/2017  PHQ - 2 Score 0 0 0 0  PHQ- 9 Score 0 0 1 0     Cognitive Function MMSE - Mini Mental State Exam 09/11/2019 09/08/2018 09/05/2017 09/05/2017  09/03/2016  Orientation to time 5 5 5 5 5   Orientation to Place 5 5 5 5 5   Registration 3 3 3 3 3   Attention/ Calculation 5 0 0 0 0  Recall 3 3 2 3 3   Recall-comments - - unable to recall 1 of 3 words - -  Language- name 2 objects - 0 0 0 0  Language- repeat 1 1 1 1 1   Language- follow 3 step command - 3 3 3 3   Language- read & follow direction - 0 0 0 0  Write a sentence - 0 0 0 0  Copy design - 0 0 0 0  Total score - 20 19 20 20   Mini Cog  Mini-Cog screen was completed. Maximum score is 22. A value of 0 denotes this part of the MMSE was not completed or the patient failed this part of the Mini-Cog screening.       Immunization History  Administered Date(s) Administered  . Fluad Quad(high Dose 65+) 04/26/2019  . Influenza Split 07/29/2011, 06/15/2012  . Influenza, High Dose Seasonal PF 05/02/2017  . Influenza,inj,Quad PF,6+ Mos 06/24/2014, 09/03/2015, 06/10/2016  . Influenza-Unspecified 05/09/2018  . Pneumococcal Conjugate-13 06/24/2014  . Pneumococcal Polysaccharide-23 12/28/2011  . Td 12/28/2011  . Zoster Recombinat (Shingrix) 12/25/2017, 03/09/2018    Qualifies for Shingles Vaccine: Completed series   Screening Tests Health Maintenance  Topic Date Due  . MAMMOGRAM  08/08/2020  . TETANUS/TDAP  12/27/2021  . INFLUENZA VACCINE  Completed  . DEXA SCAN  Completed  . Hepatitis C Screening  Completed  . PNA vac Low Risk Adult  Completed    Cancer Screenings: Lung: Low Dose CT Chest recommended if Age 46-80 years, 30 pack-year currently smoking OR have quit w/in 15 years. Patient does not qualify. Breast:  Up to date on Mammogram: Yes, completed 08/09/2019   Bone Density/Dexa:  completed 03/15/2017 Colorectal: completed 11/07/2017  Additional Screenings:  Hepatitis C Screening: 09/03/2016     Plan:   Patient will continue to walk 3-4 times a week for about 30 minutes.    I have personally reviewed and noted the following in the patient's chart:   . Medical and  social history . Use of alcohol, tobacco or illicit drugs  . Current medications and supplements . Functional ability and status . Nutritional status . Physical activity . Advanced directives . List of other physicians . Hospitalizations, surgeries, and ER visits in previous 12 months . Vitals . Screenings to include cognitive, depression, and falls . Referrals and appointments  In addition, I have reviewed and discussed with patient certain preventive protocols, quality metrics, and best practice recommendations. A written personalized care plan for preventive services as well as general preventive health recommendations were provided to patient.     Andrez Grime, LPN  [phone]

## 2019-09-11 NOTE — Progress Notes (Signed)
PCP notes:  Health Maintenance: No gaps noted   Abnormal Screenings: none   Patient concerns: Discuss whether she should be taking collagen Discuss taking a baby aspirin daily Discuss starting on memory supplements   Nurse concerns: none   Next PCP appt.: 09/17/2019 @ 10:45 am

## 2019-09-12 ENCOUNTER — Other Ambulatory Visit (INDEPENDENT_AMBULATORY_CARE_PROVIDER_SITE_OTHER): Payer: Medicare Other

## 2019-09-12 ENCOUNTER — Other Ambulatory Visit: Payer: Self-pay

## 2019-09-12 DIAGNOSIS — E7800 Pure hypercholesterolemia, unspecified: Secondary | ICD-10-CM

## 2019-09-12 DIAGNOSIS — Z0000 Encounter for general adult medical examination without abnormal findings: Secondary | ICD-10-CM

## 2019-09-12 LAB — COMPREHENSIVE METABOLIC PANEL
ALT: 16 U/L (ref 0–35)
AST: 18 U/L (ref 0–37)
Albumin: 4.6 g/dL (ref 3.5–5.2)
Alkaline Phosphatase: 79 U/L (ref 39–117)
BUN: 15 mg/dL (ref 6–23)
CO2: 29 mEq/L (ref 19–32)
Calcium: 9.9 mg/dL (ref 8.4–10.5)
Chloride: 104 mEq/L (ref 96–112)
Creatinine, Ser: 0.78 mg/dL (ref 0.40–1.20)
GFR: 72.35 mL/min (ref >60.00)
Glucose, Bld: 84 mg/dL (ref 70–99)
Potassium: 4.6 mEq/L (ref 3.5–5.1)
Sodium: 139 mEq/L (ref 135–145)
Total Bilirubin: 0.6 mg/dL (ref 0.2–1.2)
Total Protein: 7 g/dL (ref 6.0–8.3)

## 2019-09-12 LAB — CBC WITH DIFFERENTIAL/PLATELET
Basophils Absolute: 0.1 10*3/uL (ref 0.0–0.1)
Basophils Relative: 0.9 % (ref 0.0–3.0)
Eosinophils Absolute: 0.2 10*3/uL (ref 0.0–0.7)
Eosinophils Relative: 3.4 % (ref 0.0–5.0)
HCT: 36.8 % (ref 36.0–46.0)
Hemoglobin: 12.4 g/dL (ref 12.0–15.0)
Lymphocytes Relative: 39.7 % (ref 12.0–46.0)
Lymphs Abs: 2.3 10*3/uL (ref 0.7–4.0)
MCHC: 33.6 g/dL (ref 30.0–36.0)
MCV: 92.7 fl (ref 78.0–100.0)
Monocytes Absolute: 0.5 10*3/uL (ref 0.1–1.0)
Monocytes Relative: 8.3 % (ref 3.0–12.0)
Neutro Abs: 2.8 10*3/uL (ref 1.4–7.7)
Neutrophils Relative %: 47.7 % (ref 43.0–77.0)
Platelets: 290 10*3/uL (ref 150.0–400.0)
RBC: 3.97 Mil/uL (ref 3.87–5.11)
RDW: 13.4 % (ref 11.5–15.5)
WBC: 5.9 10*3/uL (ref 4.0–10.5)

## 2019-09-12 LAB — LIPID PANEL
Cholesterol: 188 mg/dL (ref 0–200)
HDL: 58 mg/dL (ref >39.00)
LDL Cholesterol: 101 mg/dL — ABNORMAL HIGH (ref 0–99)
NonHDL: 130.16
Total CHOL/HDL Ratio: 3
Triglycerides: 144 mg/dL (ref 0.0–149.0)
VLDL: 28.8 mg/dL (ref 0.0–40.0)

## 2019-09-12 LAB — TSH: TSH: 2.69 u[IU]/mL (ref 0.35–4.50)

## 2019-09-17 ENCOUNTER — Ambulatory Visit (INDEPENDENT_AMBULATORY_CARE_PROVIDER_SITE_OTHER): Payer: Medicare Other | Admitting: Family Medicine

## 2019-09-17 ENCOUNTER — Other Ambulatory Visit: Payer: Self-pay

## 2019-09-17 ENCOUNTER — Encounter: Payer: Self-pay | Admitting: Family Medicine

## 2019-09-17 VITALS — BP 134/86 | HR 80 | Temp 97.5°F | Ht 63.5 in | Wt 134.6 lb

## 2019-09-17 DIAGNOSIS — M8589 Other specified disorders of bone density and structure, multiple sites: Secondary | ICD-10-CM | POA: Diagnosis not present

## 2019-09-17 DIAGNOSIS — E2839 Other primary ovarian failure: Secondary | ICD-10-CM | POA: Diagnosis not present

## 2019-09-17 DIAGNOSIS — Z0000 Encounter for general adult medical examination without abnormal findings: Secondary | ICD-10-CM

## 2019-09-17 DIAGNOSIS — E7800 Pure hypercholesterolemia, unspecified: Secondary | ICD-10-CM | POA: Diagnosis not present

## 2019-09-17 MED ORDER — ATORVASTATIN CALCIUM 10 MG PO TABS: 10.0000 mg | ORAL_TABLET | Freq: Every day | ORAL | 3 refills | Status: DC

## 2019-09-17 NOTE — Assessment & Plan Note (Signed)
Disc goals for lipids and reasons to control them Rev last labs with pt Rev low sat fat diet in detail LDL is up- was eating more cheese Enc more beans and greens Continue atorvastatin  Handout given

## 2019-09-17 NOTE — Patient Instructions (Addendum)
Try to get 1200-1500 mg of calcium per day with at least 1000 iu of vitamin D - for bone health I placed the referral for bone density test- you can call to schedule   For cholesterol: Avoid red meat/ fried foods/ egg yolks/ fatty breakfast meats/ butter, cheese and high fat dairy/ and shellfish   Get back to your beans and greens

## 2019-09-17 NOTE — Assessment & Plan Note (Signed)
2 y dexa ordered  Last 2018- no change  No falls or fx On ca and D  Enc more exercise/walking

## 2019-09-17 NOTE — Assessment & Plan Note (Signed)
Reviewed health habits including diet and exercise and skin cancer prevention Reviewed appropriate screening tests for age  Also reviewed health mt list, fam hx and immunization status , as well as social and family history   See HPI Labs reviewed  dexa ordered  Disc ca and D for bone health as well as exercise  Good health habits  Mood is also good  Had shingrix vaccines Enc her to consider the covid vaccination series

## 2019-09-17 NOTE — Progress Notes (Signed)
Subjective:    Patient ID: Chloe Ali, female    DOB: 1945/12/14, 74 y.o.   MRN: [id number]  This visit occurred during the SARS-CoV-2 public health emergency.  Safety protocols were in place, including screening questions prior to the visit, additional usage of staff PPE, and extensive cleaning of exam room while observing appropriate contact time as indicated for disinfecting solutions.    HPI Here for health maintenance exam and to review chronic medical problems    She had amw on 2/21 No gaps noted   Feeling good  Taking care of herself  Cares for 74 yo with dementia and husband    Wt Readings from Last 3 Encounters:  09/17/19 134 lb 9 oz (61 kg)  09/11/19 133 lb (60.3 kg)  05/30/19 135 lb (61.2 kg)  good weight  Eats a healthy diet  Exercise - walking the dog regularly  (walks a lot at work also- and on stairs)  23.46 kg/m   Drinks water infused with fruit    Mammogram 2/21 Self breast exam -no lumps or changes  Sister had breast cancer   Had the shingrix vaccines   dexa 8/18 -stable osteopenia  Falls- none Fractures -none  Supplements -takes vitamin D (citrical D)  Exercise -walking   Had the shingrix series   She is waiting to see how people do with the covid vaccine before she gets it     Had a colonoscopy in 2019   BP Readings from Last 3 Encounters:  09/17/19 134/86  05/30/19 107/67  09/15/18 124/76   Pulse Readings from Last 3 Encounters:  09/17/19 80  09/15/18 65  09/08/18 73   She gives blood regularly    Hyperlipidemia Lab Results  Component Value Date   CHOL 188 09/12/2019   CHOL 150 09/08/2018   CHOL 164 09/05/2017   Lab Results  Component Value Date   HDL 58.00 09/12/2019   HDL 52.70 09/08/2018   HDL 64.30 09/05/2017   Lab Results  Component Value Date   LDLCALC 101 (H) 09/12/2019   LDLCALC 68 09/08/2018   LDLCALC 88 09/05/2017   Lab Results  Component Value Date   TRIG 144.0 09/12/2019   TRIG 146.0  09/08/2018   TRIG 55.0 09/05/2017   Lab Results  Component Value Date   CHOLHDL 3 09/12/2019   CHOLHDL 3 09/08/2018   CHOLHDL 3 09/05/2017   Lab Results  Component Value Date   LDLDIRECT 143.0 08/27/2015   LDLDIRECT 149.7 01/02/2013   LDLDIRECT 140.7 12/21/2011   Mood-h/o anxiety in the past  LDL is up from 60s from 101  Eating habits got "off" a bit  Not eating as many beans and greens   Takes atorvastatin -no problems   Lab Results  Component Value Date   WBC 5.9 09/12/2019   HGB 12.4 09/12/2019   HCT 36.8 09/12/2019   MCV 92.7 09/12/2019   PLT 290.0 09/12/2019    Lab Results  Component Value Date   TSH 2.69 09/12/2019    Lab Results  Component Value Date   CREATININE 0.78 09/12/2019   BUN 15 09/12/2019   NA 139 09/12/2019   K 4.6 09/12/2019   CL 104 09/12/2019   CO2 29 09/12/2019   Glucose 84 Lab Results  Component Value Date   ALT 16 09/12/2019   AST 18 09/12/2019   ALKPHOS 79 09/12/2019   BILITOT 0.6 09/12/2019     Pt has questions re: memory supplements and collagen  Eats a  balanced diet  Brain and body are active   Patient Active Problem List   Diagnosis Date Noted  . Colon cancer screening 09/09/2017  . History of shingles 07/25/2017  . Routine general medical examination at a health care facility 08/26/2015  . Osteopenia 08/16/2014  . Estrogen deficiency 06/24/2014  . Irritable bowel syndrome 09/04/2012  . Eczema 12/28/2011  . ALLERGIC RHINITIS CAUSE UNSPECIFIED 03/26/2010  . HYPERCHOLESTEROLEMIA 08/23/2007   Past Medical History:  Diagnosis Date  . Hay fever   . History of chicken pox   . HLD (hyperlipidemia)    Past Surgical History:  Procedure Laterality Date  . ABDOMINAL HYSTERECTOMY    . ANTERIOR AND POSTERIOR REPAIR N/A 07/22/2015   Procedure: ANTERIOR (CYSTOCELE) AND POSTERIOR REPAIR (RECTOCELE);  Surgeon: Bjorn Loser, MD;  Location: [facility];  Service: Urology;  Laterality: N/A;  . CATARACT EXTRACTION W/ INTRAOCULAR  LENS IMPLANT Left 06/2017  . CATARACT EXTRACTION W/ INTRAOCULAR LENS IMPLANT Right 07/2017  . COLONOSCOPY  09/20/2007  . CYSTO N/A 07/22/2015   Procedure: CYSTO;  Surgeon: Bjorn Loser, MD;  Location: [facility];  Service: Urology;  Laterality: N/A;  . OOPHORECTOMY    . TUBAL LIGATION  1976  . VAGINAL HYSTERECTOMY N/A 07/22/2015   Procedure: HYSTERECTOMY VAGINAL;  Surgeon: Donnamae Jude, MD;  Location: [facility];  Service: Gynecology;  Laterality: N/A;  Requested 07/22/15 @ 7:30a with Dr. Rogue Bussing to follow   Social History   Tobacco Use  . Smoking status: Never Smoker  . Smokeless tobacco: Never Used  Substance Use Topics  . Alcohol use: Yes    Alcohol/week: 8.0 standard drinks    Types: 7 Glasses of wine, 1 Shots of liquor per week  . Drug use: No   Family History  Problem Relation Age of Onset  . Pancreatic cancer Mother   . Diabetes Mother   . Cancer Mother        pancreatic  . Lung cancer Father   . Cancer Father        lung  . Stroke Maternal Grandfather   . Breast cancer Sister   . Cancer Sister 92       breast  . Colon cancer Neg Hx    No Known Allergies Current Outpatient Medications on File Prior to Visit  Medication Sig Dispense Refill  . cholecalciferol (VITAMIN D) 1000 UNITS tablet Take 1,000 Units by mouth daily.    . fluticasone (FLONASE) 50 MCG/ACT nasal spray Place 2 sprays into both nostrils daily. (Patient taking differently: Place 2 sprays into both nostrils daily as needed. ) 16 g 6  . Multiple Vitamins-Minerals (OCUVITE PO) Take 1 tablet by mouth daily.     No current facility-administered medications on file prior to visit.     Review of Systems  Constitutional: Negative for activity change, appetite change, fatigue, fever and unexpected weight change.  HENT: Negative for congestion, ear pain, rhinorrhea, sinus pressure and sore throat.        Runny nose when she wears a mask  Eyes: Negative for pain, redness and visual disturbance.    Respiratory: Negative for cough, shortness of breath and wheezing.   Cardiovascular: Negative for chest pain and palpitations.  Gastrointestinal: Negative for abdominal pain, blood in stool, constipation and diarrhea.  Endocrine: Negative for polydipsia and polyuria.  Genitourinary: Negative for dysuria, frequency and urgency.  Musculoskeletal: Negative for arthralgias, back pain and myalgias.  Skin: Negative for pallor and rash.  Allergic/Immunologic: Negative for environmental allergies.  Neurological: Negative  for dizziness, syncope and headaches.  Hematological: Negative for adenopathy. Does not bruise/bleed easily.  Psychiatric/Behavioral: Negative for decreased concentration and dysphoric mood. The patient is not nervous/anxious.        Objective:   Physical Exam Constitutional:      General: She is not in acute distress.    Appearance: Normal appearance. She is well-developed and normal weight. She is not ill-appearing or diaphoretic.  HENT:     Head: Normocephalic and atraumatic.     Right Ear: Tympanic membrane, ear canal and external ear normal.     Left Ear: Tympanic membrane, ear canal and external ear normal.     Nose: Nose normal. No congestion.     Mouth/Throat:     Mouth: Mucous membranes are moist.     Pharynx: Oropharynx is clear. No posterior oropharyngeal erythema.  Eyes:     General: No scleral icterus.    Extraocular Movements: Extraocular movements intact.     Conjunctiva/sclera: Conjunctivae normal.     Pupils: Pupils are equal, round, and reactive to light.  Neck:     Thyroid: No thyromegaly.     Vascular: No carotid bruit or JVD.  Cardiovascular:     Rate and Rhythm: Normal rate and regular rhythm.     Pulses: Normal pulses.     Heart sounds: Normal heart sounds. No gallop.   Pulmonary:     Effort: Pulmonary effort is normal. No respiratory distress.     Breath sounds: Normal breath sounds. No wheezing.     Comments: Good air exch Chest:      Chest wall: No tenderness.  Abdominal:     General: Bowel sounds are normal. There is no distension or abdominal bruit.     Palpations: Abdomen is soft. There is no mass.     Tenderness: There is no abdominal tenderness.     Hernia: No hernia is present.  Genitourinary:    Comments: Breast exam: No mass, nodules, thickening, tenderness, bulging, retraction, inflamation, nipple discharge or skin changes noted.  No axillary or clavicular LA.     Musculoskeletal:        General: No tenderness. Normal range of motion.     Cervical back: Normal range of motion and neck supple. No rigidity. No muscular tenderness.     Right lower leg: No edema.     Left lower leg: No edema.  Lymphadenopathy:     Cervical: No cervical adenopathy.  Skin:    General: Skin is warm and dry.     Coloration: Skin is not pale.     Findings: No erythema or rash.     Comments: Solar lentigines diffusely Some dry areas  Neurological:     Mental Status: She is alert. Mental status is at baseline.     Cranial Nerves: No cranial nerve deficit.     Motor: No abnormal muscle tone.     Coordination: Coordination normal.     Gait: Gait normal.     Deep Tendon Reflexes: Reflexes are normal and symmetric. Reflexes normal.  Psychiatric:        Mood and Affect: Mood normal.        Cognition and Memory: Cognition and memory normal.     Comments: Mentally sharp           Assessment & Plan:   Problem List Items Addressed This Visit      Musculoskeletal and Integument   Osteopenia    2 y dexa ordered  Last 2018- no  change  No falls or fx On ca and D  Enc more exercise/walking        Other   HYPERCHOLESTEROLEMIA    Disc goals for lipids and reasons to control them Rev last labs with pt Rev low sat fat diet in detail LDL is up- was eating more cheese Enc more beans and greens Continue atorvastatin  Handout given      Relevant Medications   atorvastatin (LIPITOR) 10 MG tablet   Estrogen deficiency    Relevant Orders   DG Bone Density   Routine general medical examination at a health care facility - Primary    Reviewed health habits including diet and exercise and skin cancer prevention Reviewed appropriate screening tests for age  Also reviewed health mt list, fam hx and immunization status , as well as social and family history   See HPI Labs reviewed  dexa ordered  Disc ca and D for bone health as well as exercise  Good health habits  Mood is also good  Had shingrix vaccines Enc her to consider the covid vaccination series

## 2019-10-23 ENCOUNTER — Ambulatory Visit
Admission: RE | Admit: 2019-10-23 | Discharge: 2019-10-23 | Disposition: A | Discharge status: Home / Self Care | Payer: Medicare Other | Source: Ambulatory Visit | Attending: Family Medicine | Admitting: Family Medicine

## 2019-10-23 DIAGNOSIS — Z780 Asymptomatic menopausal state: Secondary | ICD-10-CM | POA: Diagnosis not present

## 2019-10-23 DIAGNOSIS — M8589 Other specified disorders of bone density and structure, multiple sites: Secondary | ICD-10-CM | POA: Diagnosis not present

## 2019-10-23 DIAGNOSIS — E2839 Other primary ovarian failure: Secondary | ICD-10-CM

## 2019-10-29 ENCOUNTER — Encounter: Payer: Self-pay | Admitting: *Deleted

## 2019-11-01 DIAGNOSIS — H43811 Vitreous degeneration, right eye: Secondary | ICD-10-CM | POA: Diagnosis not present

## 2019-11-01 DIAGNOSIS — H35371 Puckering of macula, right eye: Secondary | ICD-10-CM | POA: Diagnosis not present

## 2019-11-01 DIAGNOSIS — H04123 Dry eye syndrome of bilateral lacrimal glands: Secondary | ICD-10-CM | POA: Diagnosis not present

## 2019-11-01 DIAGNOSIS — Z961 Presence of intraocular lens: Secondary | ICD-10-CM | POA: Diagnosis not present

## 2019-11-19 ENCOUNTER — Ambulatory Visit: Payer: Medicare Other | Admitting: Family Medicine

## 2019-11-23 ENCOUNTER — Ambulatory Visit: Payer: Medicare Other | Admitting: Dermatology

## 2019-11-23 ENCOUNTER — Other Ambulatory Visit: Payer: Self-pay

## 2019-11-23 ENCOUNTER — Encounter: Payer: Self-pay | Admitting: Dermatology

## 2019-11-23 DIAGNOSIS — L820 Inflamed seborrheic keratosis: Secondary | ICD-10-CM

## 2019-11-23 DIAGNOSIS — L719 Rosacea, unspecified: Secondary | ICD-10-CM

## 2019-11-23 NOTE — Patient Instructions (Addendum)
Recommend daily broad spectrum sunscreen SPF 30+ to sun-exposed areas, reapply every 2 hours as needed. Call for new or changing lesions.  Instructions for [facility] Medications  One or more of your medications was sent to the [facility] mail order compounding pharmacy. You will receive an email from them and can purchase the medicine through that link. It will then be mailed to your home at the address you confirmed. If for any reason you do not receive an email from them, please check your spam folder. If you still do not find the email, please let us know.

## 2019-11-23 NOTE — Progress Notes (Signed)
   Follow-Up Visit   Subjective  Chloe Ali is a 74 y.o. female who presents for the following: Scaly spot on face (has a scaly spot on her right side of face, has been there for severl months, itches on sometimes, no pain. has had laser treatments in the past, used a cream that didn't help and would like a new kind of cream). Redness persists.  The following portions of the chart were reviewed this encounter and updated as appropriate:     Review of Systems:  No other skin or systemic complaints except as noted in HPI or Assessment and Plan.  Objective  Well appearing patient in no apparent distress; mood and affect are within normal limits.  A focused examination was performed including face. Relevant physical exam findings are noted in the Assessment and Plan.  Objective  Right Lower cheek: Erythematous keratotic waxy stuck-on flat papule  Objective  mid face: Mild erythema with telangiectasia on nose and malar cheeks   Assessment & Plan  Inflamed seborrheic keratosis Right Lower cheek  Cryotherapy today       Destruction of lesion - Right Lower cheek  Destruction method: cryotherapy   Informed consent: discussed and consent obtained   Lesion destroyed using liquid nitrogen: Yes   Region frozen until ice ball extended beyond lesion: Yes   Outcome: patient tolerated procedure well with no complications   Post-procedure details: wound care instructions given    Rosacea mid face  Start [facility] metronidazole/ivermectin/azelaic acid twice daily as needed to affected areas on the face. The patient was advised this is not covered by insurance. She will receive an email to check out and the medication will be mailed to their home.  #60g 2 RF  Continue Elta MD sunscreen to face daily   Return if symptoms worsen or fail to improve patient will call.  Marene Lenz, CMA, am acting as scribe for [name], MD .  Documentation: I have reviewed the  above documentation for accuracy and completeness, and I agree with the above.  [name], MD

## 2020-04-16 ENCOUNTER — Encounter (INDEPENDENT_AMBULATORY_CARE_PROVIDER_SITE_OTHER): Payer: Self-pay | Admitting: Ophthalmology

## 2020-04-16 ENCOUNTER — Ambulatory Visit (INDEPENDENT_AMBULATORY_CARE_PROVIDER_SITE_OTHER): Payer: Medicare Other | Admitting: Ophthalmology

## 2020-04-16 ENCOUNTER — Other Ambulatory Visit: Payer: Self-pay

## 2020-04-16 DIAGNOSIS — H35372 Puckering of macula, left eye: Secondary | ICD-10-CM

## 2020-04-16 DIAGNOSIS — H353131 Nonexudative age-related macular degeneration, bilateral, early dry stage: Secondary | ICD-10-CM

## 2020-04-16 DIAGNOSIS — Z961 Presence of intraocular lens: Secondary | ICD-10-CM | POA: Diagnosis not present

## 2020-04-16 DIAGNOSIS — Z9889 Other specified postprocedural states: Secondary | ICD-10-CM

## 2020-04-16 NOTE — Assessment & Plan Note (Addendum)
,   continued improvement in macular thickeningStatus post vitrectomy, repair, March 2020

## 2020-04-16 NOTE — Assessment & Plan Note (Signed)

## 2020-04-16 NOTE — Progress Notes (Signed)
04/16/2020     CHIEF COMPLAINT Patient presents for Retina Follow Up   HISTORY OF PRESENT ILLNESS: Chloe Ali is a 74 y.o. female who presents to the clinic today for:   HPI    Retina Follow Up    Patient presents with  Dry AMD.  In both eyes.  This started 1 year ago.  Severity is mild.  Duration of 1 year.  Since onset it is stable.          Comments    1 Year AMD F/U OU  Pt reports fluctuating VA OU, and sts sometimes she isn't able to see as well as other times. Pt denies ocular pain or flashes of light. Pt reports floaters off and on OU, and intermittent spiderwebs. Pt reports using OTC allergy eye drops.        Last edited by Rockie Neighbours, [place] on 04/16/2020 10:00 AM. (History)      Referring physician: Tower, Wynelle Fanny, MD [place],  [place] [id number]  HISTORICAL INFORMATION:   Selected notes from the [facility]: No current outpatient medications on file. (Ophthalmic Drugs)   No current facility-administered medications for this visit. (Ophthalmic Drugs)   Current Outpatient Medications (Other)  Medication Sig  . atorvastatin (LIPITOR) 10 MG tablet Take 1 tablet (10 mg total) by mouth daily.  . busPIRone (BUSPAR) 15 MG tablet tablet  . cholecalciferol (VITAMIN D) 1000 UNITS tablet Take 1,000 Units by mouth daily.  . fluticasone (FLONASE) 50 MCG/ACT nasal spray Place 2 sprays into both nostrils daily. (Patient taking differently: Place 2 sprays into both nostrils daily as needed. )  . metroNIDAZOLE (METROCREAM) 0.75 % cream Apply topically in the morning.  . Multiple Vitamins-Minerals (OCUVITE PO) Take 1 tablet by mouth daily.   No current facility-administered medications for this visit. (Other)      REVIEW OF SYSTEMS:    ALLERGIES No Known Allergies  PAST MEDICAL HISTORY Past Medical History:  Diagnosis Date  . Actinic keratosis   . Hay fever   . History of chicken pox   . HLD  (hyperlipidemia)   . Rosacea    Past Surgical History:  Procedure Laterality Date  . ABDOMINAL HYSTERECTOMY    . ANTERIOR AND POSTERIOR REPAIR N/A 07/22/2015   Procedure: ANTERIOR (CYSTOCELE) AND POSTERIOR REPAIR (RECTOCELE);  Surgeon: Bjorn Loser, MD;  Location: [facility];  Service: Urology;  Laterality: N/A;  . CATARACT EXTRACTION W/ INTRAOCULAR LENS IMPLANT Left 06/2017  . CATARACT EXTRACTION W/ INTRAOCULAR LENS IMPLANT Right 07/2017  . COLONOSCOPY  09/20/2007  . CYSTO N/A 07/22/2015   Procedure: CYSTO;  Surgeon: Bjorn Loser, MD;  Location: [facility];  Service: Urology;  Laterality: N/A;  . TUBAL LIGATION  1976  . VAGINAL HYSTERECTOMY N/A 07/22/2015   Procedure: HYSTERECTOMY VAGINAL;  Surgeon: Donnamae Jude, MD;  Location: [facility];  Service: Gynecology;  Laterality: N/A;  Requested 07/22/15 @ 7:30a with Dr. Rogue Bussing to follow    FAMILY HISTORY Family History  Problem Relation Age of Onset  . Pancreatic cancer Mother   . Diabetes Mother   . Cancer Mother        pancreatic  . Lung cancer Father   . Cancer Father        lung  . Stroke Maternal Grandfather   . Breast cancer Sister   . Cancer Sister 32       breast  . Colon cancer  Neg Hx     SOCIAL HISTORY Social History   Tobacco Use  . Smoking status: Never Smoker  . Smokeless tobacco: Never Used  Vaping Use  . Vaping Use: Never used  Substance Use Topics  . Alcohol use: Yes    Alcohol/week: 8.0 standard drinks    Types: 7 Glasses of wine, 1 Shots of liquor per week  . Drug use: No         OPHTHALMIC EXAM: Base Eye Exam    Visual Acuity (ETDRS)      Right Left   Dist [place] 20/30 +1 20/25 -2   Dist ph [place] 20/25        Tonometry (Tonopen, 9:58 AM)      Right Left   Pressure 16 16       Pupils      Pupils Dark Light Shape React APD   Right PERRL 4 3 Round Brisk None   Left PERRL 4 3 Round Brisk None       Visual Fields (Counting fingers)      Left Right    Full Full       Extraocular Movement       Right Left    Full Full       Neuro/Psych    Oriented x3: Yes   Mood/Affect: Normal       Dilation    Both eyes: 1.0% Mydriacyl, 2.5% Phenylephrine @ 10:04 AM        Slit Lamp and Fundus Exam    Slit Lamp Exam      Right Left   Lens Posterior chamber intraocular lens Posterior chamber intraocular lens          IMAGING AND PROCEDURES  Imaging and Procedures for 04/16/20  OCT, Retina - OU - Both Eyes       Right Eye Quality was good. Scan locations included subfoveal. Central Foveal Thickness: 266. Progression has been stable. Findings include normal foveal contour, retinal drusen .   Left Eye Quality was good. Scan locations included subfoveal. Central Foveal Thickness: 404. Progression has improved. Findings include abnormal foveal contour.   Notes Posterior vitreous detachment OD, stable OS, post op vitrectomy, ILM peel in years past continued slow improvement in the macular thickening, observe                ASSESSMENT/PLAN:  Left epiretinal membrane  , continued improvement in macular thickeningStatus post vitrectomy, repair, March 2020  Early stage nonexudative age-related macular degeneration of both eyes The nature of age--related macular degeneration was discussed with the patient as well as the distinction between dry and wet types. Checking an Amsler Grid daily with advice to return immediately should a distortion develop, was given to the patient. The patient 's smoking status now and in the past was determined and advice based on the AREDS study was provided regarding the consumption of antioxidant supplements. AREDS 2 vitamin formulation was recommended. Consumption of dark leafy vegetables and fresh fruits of various colors was recommended. Treatment modalities for wet macular degeneration particularly the use of intravitreal injections of anti-blood vessel growth factors was discussed with the patient. Avastin, Lucentis, and Eylea are the  available options. On occasion, therapy includes the use of photodynamic therapy and thermal laser. Stressed to the patient do not rub eyes.  Patient was advised to check Amsler Grid daily and return immediately if changes are noted. Instructions on using the grid were given to the patient. All patient questions were answered.  ICD-10-CM   1. Early stage nonexudative age-related macular degeneration of both eyes  H35.3131 OCT, Retina - OU - Both Eyes  2. Left epiretinal membrane  H35.372 OCT, Retina - OU - Both Eyes  3. Pseudophakia, both eyes  Z96.1   4. History of vitrectomy  Z98.890     1.  No  Active maculopathy OU  2.  Continues on oral AREDS 2 supplementation for ARMD which frankly is of a mild to moderate amount in each eye.  3.  Follows up with Dr. Carolynn Sayers and Nathanial Rancher [facility] annually Ophthalmic Meds Ordered this visit:  No orders of the defined types were placed in this encounter.      Return in about 2 years (around 04/16/2022) for dilate, OCT.  There are no Patient Instructions on file for this visit.   Explained the diagnoses, plan, and follow up with the patient and they expressed understanding.  Patient expressed understanding of the importance of proper follow up care.   Clent Demark Elijahjames Fuelling M.D. Diseases & Surgery of the Retina and Vitreous Retina & Diabetic [place] 04/16/20     Abbreviations: M myopia (nearsighted); A astigmatism; H hyperopia (farsighted); P presbyopia; Mrx spectacle prescription;  CTL contact lenses; OD right eye; OS left eye; OU both eyes  XT exotropia; ET esotropia; PEK punctate epithelial keratitis; PEE punctate epithelial erosions; DES dry eye syndrome; MGD meibomian gland dysfunction; ATs artificial tears; PFAT's preservative free artificial tears; [place] nuclear sclerotic cataract; PSC posterior subcapsular cataract; ERM epi-retinal membrane; PVD posterior vitreous detachment; RD retinal detachment; DM diabetes mellitus; DR diabetic  retinopathy; NPDR non-proliferative diabetic retinopathy; PDR proliferative diabetic retinopathy; CSME clinically significant macular edema; DME diabetic macular edema; dbh dot blot hemorrhages; CWS cotton wool spot; POAG primary open angle glaucoma; C/D cup-to-disc ratio; HVF humphrey visual field; GVF goldmann visual field; OCT optical coherence tomography; IOP intraocular pressure; BRVO Branch retinal vein occlusion; CRVO central retinal vein occlusion; CRAO central retinal artery occlusion; BRAO branch retinal artery occlusion; RT retinal tear; SB scleral buckle; PPV pars plana vitrectomy; VH Vitreous hemorrhage; PRP panretinal laser photocoagulation; IVK intravitreal kenalog; VMT vitreomacular traction; MH Macular hole;  NVD neovascularization of the disc; NVE neovascularization elsewhere; AREDS age related eye disease study; ARMD age related macular degeneration; POAG primary open angle glaucoma; EBMD epithelial/anterior basement membrane dystrophy; ACIOL anterior chamber intraocular lens; IOL intraocular lens; PCIOL posterior chamber intraocular lens; Phaco/IOL phacoemulsification with intraocular lens placement; [place] photorefractive keratectomy; LASIK laser assisted in situ keratomileusis; HTN hypertension; DM diabetes mellitus; COPD chronic obstructive pulmonary disease

## 2020-05-26 ENCOUNTER — Telehealth: Payer: Self-pay

## 2020-05-26 MED ORDER — DOXYCYCLINE HYCLATE 20 MG PO TABS
20.0000 mg | ORAL_TABLET | Freq: Two times a day (BID) | ORAL | 2 refills | Status: AC
Start: 2020-05-26 — End: 2020-06-25

## 2020-05-26 NOTE — Telephone Encounter (Signed)
Patient calling stating she has been [facility] cream since April with no improvement. She would like try something stronger.

## 2020-05-26 NOTE — Telephone Encounter (Signed)
The next step would be to add in a low dose oral antibiotic.  Can send in Oracea (generic ok) 40 mg, take 1 tab PO qd.  (If not covered then send in Doxy 20 mg PO bid).  Continue topical cream.  Schedule f/up with me in 6-8 weeks.

## 2020-05-26 NOTE — Telephone Encounter (Signed)
Patient advised of information per Dr. Nicole Kindred. RX sent in and we have schedule patient a follow up appointment.

## 2020-07-01 ENCOUNTER — Ambulatory Visit: Payer: Medicare Other | Admitting: Dermatology

## 2020-07-01 ENCOUNTER — Other Ambulatory Visit: Payer: Self-pay

## 2020-07-01 DIAGNOSIS — L820 Inflamed seborrheic keratosis: Secondary | ICD-10-CM | POA: Diagnosis not present

## 2020-07-01 DIAGNOSIS — R21 Rash and other nonspecific skin eruption: Secondary | ICD-10-CM | POA: Diagnosis not present

## 2020-07-01 DIAGNOSIS — L719 Rosacea, unspecified: Secondary | ICD-10-CM | POA: Diagnosis not present

## 2020-07-01 DIAGNOSIS — L578 Other skin changes due to chronic exposure to nonionizing radiation: Secondary | ICD-10-CM | POA: Diagnosis not present

## 2020-07-01 MED ORDER — MOMETASONE FUROATE 0.1 % EX CREA: TOPICAL_CREAM | CUTANEOUS | 0 refills | Status: DC

## 2020-07-01 NOTE — Progress Notes (Signed)
Follow-Up Visit   Subjective  Chloe Ali is a 74 y.o. female who presents for the following: Rosacea (6 mo f/u for rosacea).   Pt has been treating the rosacea with the skin medicinals cream. She states that she has not seen any improvement with the cream. She has has 3 IPL treatments.  Pt called last month and was given Oracea 40 mg QD. Since taking the oracea, she has broken out in a red patchy rash on her hands, chest, and face. She also has a irritated growth on her left cheek.   The following portions of the chart were reviewed this encounter and updated as appropriate:     Review of Systems: No other skin or systemic complaints except as noted in HPI or Assessment and Plan.   Objective  Well appearing patient in no apparent distress; mood and affect are within normal limits.  A focused examination was performed including face, chest, hands. Relevant physical exam findings are noted in the Assessment and Plan.  Objective  face: Mid face erythema with telangiectasias   Images    Objective  face, chest, hands: Pink scaly macules on chest, face (with edema), chest, and hands  Objective  Left cheek: Erythematous keratotic or waxy stuck-on papule   Assessment & Plan  Rosacea face  Vrs chronic actinic damage Continue Skin medicinals cream bid once swelling calms down.  D/c Oracea due to skin reaction.    Rosacea is a chronic progressive skin condition usually affecting the face of adults, causing redness and/or acne bumps. It is treatable but not curable. It sometimes affects the eyes (ocular rosacea) as well. It may respond to topical and/or systemic medication and can flare with stress, sun exposure, alcohol, exercise and some foods.  Daily application of broad spectrum spf 30+ sunscreen to face is recommended to reduce flares.   Rash face, chest, hands  Possible drug rash from doxycycline Discontinue Oracea.   Start mometasone cream. Apply to chest,  hands, and face. Use BID for one week. Then once a day for one week or until rash cleared  mometasone (ELOCON) 0.1 % cream - face, chest, hands  Inflamed seborrheic keratosis Left cheek  Destruction of lesion - Left cheek  Destruction method: cryotherapy   Destruction method comment:  Electrodessication Informed consent: discussed and consent obtained   Lesion destroyed using liquid nitrogen: Yes   Region frozen until ice ball extended beyond lesion: Yes   Outcome: patient tolerated procedure well with no complications   Post-procedure details: wound care instructions given   Additional details:  Prior to procedure, discussed risks of blister formation, small wound, skin dyspigmentation, or rare scar following cryotherapy.  Actinic Damage - Severe, chronic, secondary to cumulative UV radiation exposure over time - diffuse scaly erythematous macules and papules with underlying dyspigmentation - Discussed "Field Treatment" for Severe, Confluent Actinic Changes with Pre-Cancerous Actinic Keratoses Field treatment involves treatment of an entire area of skin that has confluent Actinic Changes (Sun/ Ultraviolet light damage) and PreCancerous Actinic Keratoses by method of PhotoDynamic Therapy (PDT) and/or prescription Topical Chemotherapy agents such as 5-fluorouracil, 5-fluorouracil/calcipotriene, and/or imiquimod.  The purpose is to decrease the number of clinically evident and subclinical PreCancerous lesions to prevent progression to development of skin cancer by chemically destroying early precancer changes that may or may not be visible.  It has been shown to reduce the risk of developing skin cancer in the treated area. As a result of treatment, redness, scaling, crusting, and open sores  may occur during treatment course. One or more than one of these methods may be used and may have to be used several times to control, suppress and eliminate the PreCancerous changes. Discussed treatment  course, expected reaction, and possible side effects.  Recommend PDT to face x 2 treatments. - Recommend daily broad spectrum sunscreen SPF 30+ to sun-exposed areas, reapply every 2 hours as needed.  - Call for new or changing lesions.   Return in about 3 months (around 09/29/2020) for follow up from PDT. 1 month for PDT face, 2 treatments one month apart.   I, Harriett Sine, CMA, am acting as scribe for [name], MD.  Documentation: I have reviewed the above documentation for accuracy and completeness, and I agree with the above.  [name] MD

## 2020-08-14 ENCOUNTER — Other Ambulatory Visit: Payer: Self-pay

## 2020-08-14 ENCOUNTER — Ambulatory Visit: Payer: Medicare Other

## 2020-08-14 DIAGNOSIS — L570 Actinic keratosis: Secondary | ICD-10-CM

## 2020-08-14 MED ORDER — AMINOLEVULINIC ACID HCL 20 % EX SOLR
1.0000 | Freq: Once | CUTANEOUS | Status: AC
Start: 2020-08-14 — End: 2020-08-14
  Administered 2020-08-14: 354 mg via TOPICAL

## 2020-08-14 NOTE — Patient Instructions (Signed)

## 2020-08-14 NOTE — Progress Notes (Signed)
Patient completed PDT therapy today.  1. AK (actinic keratosis) Head - Anterior (Face)  Photodynamic therapy - Head - Anterior (Face) Procedure discussed: discussed risks, benefits, side effects. and alternatives   Prep: site scrubbed/prepped with acetone   Location:  Face Number of lesions:  Multiple Type of treatment:  Blue light Aminolevulinic Acid (see MAR for details): Levulan Number of Levulan sticks used:  1 Incubation time (minutes):  60 Number of minutes under lamp:  16 Number of seconds under lamp:  40 Cooling:  Floor fan Outcome: patient tolerated procedure well with no complications   Post-procedure details: sunscreen applied     

## 2020-09-05 ENCOUNTER — Telehealth: Payer: Self-pay | Admitting: Family Medicine

## 2020-09-05 DIAGNOSIS — Z0000 Encounter for general adult medical examination without abnormal findings: Secondary | ICD-10-CM

## 2020-09-05 DIAGNOSIS — E7800 Pure hypercholesterolemia, unspecified: Secondary | ICD-10-CM

## 2020-09-05 NOTE — Telephone Encounter (Signed)
-----   Message from Ellamae Sia sent at 08/26/2020  2:58 PM EST ----- Regarding: Lab orders for Friday, 2.18.22 Patient is scheduled for CPX labs, please order future labs, Thanks , Karna Christmas

## 2020-09-08 ENCOUNTER — Other Ambulatory Visit: Payer: Self-pay | Admitting: Family Medicine

## 2020-09-09 ENCOUNTER — Ambulatory Visit: Payer: Medicare Other

## 2020-09-09 ENCOUNTER — Other Ambulatory Visit: Payer: Self-pay

## 2020-09-09 DIAGNOSIS — L570 Actinic keratosis: Secondary | ICD-10-CM

## 2020-09-09 MED ORDER — AMINOLEVULINIC ACID HCL 20 % EX SOLR
1.0000 "application " | Freq: Once | CUTANEOUS | Status: AC
  Administered 2020-09-09: 354 mg via TOPICAL

## 2020-09-09 NOTE — Progress Notes (Signed)
1. AK (actinic keratosis) Face  Photodynamic therapy - Face Procedure discussed: discussed risks, benefits, side effects. and alternatives   Prep: site scrubbed/prepped with acetone   Location:  Face Number of lesions:  Multiple Type of treatment:  Blue light Aminolevulinic Acid (see MAR for details): Levulan Number of Levulan sticks used:  1 Incubation time (minutes):  60 Number of minutes under lamp:  16 Number of seconds under lamp:  40 Cooling:  Floor fan Outcome: patient tolerated procedure well with no complications   Post-procedure details: sunscreen applied and aftercare instructions given to patient    Aminolevulinic Acid HCl 20 % SOLR 354 mg - Face    

## 2020-09-09 NOTE — Patient Instructions (Signed)
Levulan/PDT Treatment Common Side Effects  - Burning/stinging, which may be severe and last up to 24-72 hours after your treatment  - Redness, swelling and/or peeling which may last up to 4 weeks  - Scaling/crusting which may last up to 2 weeks  - Sun sensitivity (you MUST avoid sun exposure for 48-72 hours after treatment)  Care Instructions  - Okay to wash with soap and water and shampoo as normal  - If needed, you can do a cold compress (ex. Ice packs) for comfort  - If okay with your Primary Doctor, you may use analgesics such as Tylenol every 4-6 hours, not to exceed recommended dose  - You may apply Cerave Healing Ointment, Vaseline or Aquaphor  - If you have a lot of swelling you may take a Benadryl to help with this (this may cause drowsiness)  Sun Precautions  - Wear a wide brim hat for the next week if outside  - Wear a sunblock with zinc or titanium dioxide at least SPF 50 daily   We will recheck you in 10-12 weeks. If any problems, please call the office and ask to speak with a nurse.       Gentle Skin Care Guide  1. Bathe no more than once a day.  2. Avoid bathing in hot water  3. Use a mild soap like Dove, Vanicream, Cetaphil, CeraVe. Can use Lever 2000 or Cetaphil antibacterial soap  4. Use soap only where you need it. On most days, use it under your arms, between your legs, and on your feet. Let the water rinse other areas unless visibly dirty.  5. When you get out of the bath/shower, use a towel to gently blot your skin dry, don't rub it.  6. While your skin is still a little damp, apply a moisturizing cream such as Vanicream, CeraVe, Cetaphil, Eucerin, Sarna lotion or plain Vaseline Jelly. For hands apply Neutrogena [place] Hand Cream or Excipial Hand Cream.  7. Reapply moisturizer any time you start to itch or feel dry.  8. Sometimes using free and clear laundry detergents can be helpful. Fabric softener sheets should be avoided. Downy Free &  Gentle liquid, or any liquid fabric softener that is free of dyes and perfumes, it acceptable to use  9. If your doctor has given you prescription creams you may apply moisturizers over them

## 2020-09-12 ENCOUNTER — Other Ambulatory Visit (INDEPENDENT_AMBULATORY_CARE_PROVIDER_SITE_OTHER): Payer: Medicare Other

## 2020-09-12 ENCOUNTER — Other Ambulatory Visit: Payer: Self-pay

## 2020-09-12 DIAGNOSIS — E7800 Pure hypercholesterolemia, unspecified: Secondary | ICD-10-CM

## 2020-09-12 DIAGNOSIS — Z0000 Encounter for general adult medical examination without abnormal findings: Secondary | ICD-10-CM | POA: Diagnosis not present

## 2020-09-12 LAB — CBC WITH DIFFERENTIAL/PLATELET
Basophils Absolute: 0 10*3/uL (ref 0.0–0.1)
Basophils Relative: 0.7 % (ref 0.0–3.0)
Eosinophils Absolute: 0.1 10*3/uL (ref 0.0–0.7)
Eosinophils Relative: 2.4 % (ref 0.0–5.0)
HCT: 38.8 % (ref 36.0–46.0)
Hemoglobin: 13.1 g/dL (ref 12.0–15.0)
Lymphocytes Relative: 38.8 % (ref 12.0–46.0)
Lymphs Abs: 2.3 10*3/uL (ref 0.7–4.0)
MCHC: 33.8 g/dL (ref 30.0–36.0)
MCV: 96.4 fl (ref 78.0–100.0)
Monocytes Absolute: 0.5 10*3/uL (ref 0.1–1.0)
Monocytes Relative: 8.3 % (ref 3.0–12.0)
Neutro Abs: 3 10*3/uL (ref 1.4–7.7)
Neutrophils Relative %: 49.8 % (ref 43.0–77.0)
Platelets: 280 10*3/uL (ref 150.0–400.0)
RBC: 4.02 Mil/uL (ref 3.87–5.11)
RDW: 13.4 % (ref 11.5–15.5)
WBC: 6 10*3/uL (ref 4.0–10.5)

## 2020-09-12 LAB — COMPREHENSIVE METABOLIC PANEL
ALT: 17 U/L (ref 0–35)
AST: 21 U/L (ref 0–37)
Albumin: 4.5 g/dL (ref 3.5–5.2)
Alkaline Phosphatase: 73 U/L (ref 39–117)
BUN: 10 mg/dL (ref 6–23)
CO2: 29 mEq/L (ref 19–32)
Calcium: 9.8 mg/dL (ref 8.4–10.5)
Chloride: 101 mEq/L (ref 96–112)
Creatinine, Ser: 0.8 mg/dL (ref 0.40–1.20)
GFR: 72.69 mL/min (ref >60.00)
Glucose, Bld: 83 mg/dL (ref 70–99)
Potassium: 4.8 mEq/L (ref 3.5–5.1)
Sodium: 135 mEq/L (ref 135–145)
Total Bilirubin: 0.7 mg/dL (ref 0.2–1.2)
Total Protein: 6.9 g/dL (ref 6.0–8.3)

## 2020-09-12 LAB — LIPID PANEL
Cholesterol: 165 mg/dL (ref 0–200)
HDL: 52.2 mg/dL (ref >39.00)
LDL Cholesterol: 81 mg/dL (ref 0–99)
NonHDL: 112.61
Total CHOL/HDL Ratio: 3
Triglycerides: 159 mg/dL — ABNORMAL HIGH (ref 0.0–149.0)
VLDL: 31.8 mg/dL (ref 0.0–40.0)

## 2020-09-12 LAB — TSH: TSH: 2.63 u[IU]/mL (ref 0.35–4.50)

## 2020-09-17 ENCOUNTER — Ambulatory Visit: Payer: Self-pay

## 2020-09-19 ENCOUNTER — Encounter: Payer: Self-pay | Admitting: Family Medicine

## 2020-09-19 ENCOUNTER — Ambulatory Visit (INDEPENDENT_AMBULATORY_CARE_PROVIDER_SITE_OTHER): Payer: Medicare Other | Admitting: Family Medicine

## 2020-09-19 ENCOUNTER — Other Ambulatory Visit: Payer: Self-pay

## 2020-09-19 VITALS — BP 128/70 | HR 77 | Temp 96.9°F | Ht 63.25 in | Wt 131.4 lb

## 2020-09-19 DIAGNOSIS — E7800 Pure hypercholesterolemia, unspecified: Secondary | ICD-10-CM | POA: Diagnosis not present

## 2020-09-19 DIAGNOSIS — Z0000 Encounter for general adult medical examination without abnormal findings: Secondary | ICD-10-CM | POA: Insufficient documentation

## 2020-09-19 DIAGNOSIS — M8589 Other specified disorders of bone density and structure, multiple sites: Secondary | ICD-10-CM | POA: Diagnosis not present

## 2020-09-19 MED ORDER — ATORVASTATIN CALCIUM 10 MG PO TABS: 10.0000 mg | ORAL_TABLET | Freq: Every day | ORAL | 3 refills | Status: DC

## 2020-09-19 MED ORDER — ALENDRONATE SODIUM 70 MG PO TABS: 70.0000 mg | ORAL_TABLET | ORAL | 3 refills | Status: DC

## 2020-09-19 NOTE — Patient Instructions (Addendum)
Schedule your mammogram when you can   Try the alendronate for your bones  Alert [place] if any side effects Continue your vitamin D   Please work on the advance directive and get it notarized   Stay active  Take care of yourself

## 2020-09-19 NOTE — Progress Notes (Signed)
Subjective:    Patient ID: Chloe Ali, female    DOB: [birth year], 75 y.o.   MRN: [id number]  This visit occurred during the SARS-CoV-2 public health emergency.  Safety protocols were in place, including screening questions prior to the visit, additional usage of staff PPE, and extensive cleaning of exam room while observing appropriate contact time as indicated for disinfecting solutions.    HPI Pt presents for amw and health mt exam  I have personally reviewed the Medicare Annual Wellness questionnaire and have noted 1. The patient's medical and social history 2. Their use of alcohol, tobacco or illicit drugs 3. Their current medications and supplements 4. The patient's functional ability including ADL's, fall risks, home safety risks and hearing or visual             impairment. 5. Diet and physical activities 6. Evidence for depression or mood disorders  The patients weight, height, BMI have been recorded in the chart and visual acuity is per eye clinic.  I have made referrals, counseling and provided education to the patient based review of the above and I have provided the pt with a written personalized care plan for preventive services. Reviewed and updated provider list, see scanned forms.  See scanned forms.  Routine anticipatory guidance given to patient.  See health maintenance. Colon cancer screening  Colonoscopy 4/19  Breast cancer screening  Mammogram 1/21, [place], will schedule  Self breast exam- no lumps  Flu vaccine - this fall  Tetanus vaccine Td 6/13 Pneumovax competed Zoster vaccine-had shingrix series covid status-non immunized, declines and no questions  Has not had covid  Dexa 3/21 osteopenia (worsened at forearm) Falls- none Fractures-none Supplements-takes vit D  Exercise -work   Scientist, research (medical) to work on  Museum/gallery conservator function addressed- see scanned forms- and if abnormal then additional documentation follows.  Works and  quite mentally sharp / gets overloaded at times  Takes care of everything  Sometimes does not finish a task when very busy   PMH and [place] reviewed  Meds, vitals, and allergies reviewed.   ROS: See HPI.  Otherwise negative.    Weight : Wt Readings from Last 3 Encounters:  09/19/20 131 lb 7 oz (59.6 kg)  09/17/19 134 lb 9 oz (61 kg)  09/11/19 133 lb (60.3 kg)   23.10 kg/m  She is a caregiver (2 people)  Not a lot of time for self care  Never sits down  No extra exercise   Hearing/vision:   Hearing Screening   125Hz  250Hz  500Hz  1000Hz  2000Hz  3000Hz  4000Hz  6000Hz  8000Hz   Right ear:   40 40 40  0    Left ear:   40 40 40  0      Visual Acuity Screening   Right eye Left eye Both eyes  Without correction: 20/25 20/25 20/25   With correction:      Hearing does not bother her at all    Care team Lianah Peed-pcp Herb Grays- Derm  BP Readings from Last 3 Encounters:  09/19/20 128/70  09/17/19 134/86  05/30/19 107/67   Pulse Readings from Last 3 Encounters:  09/19/20 77  09/17/19 80  09/15/18 65   Hyperlipidemia Lab Results  Component Value Date   CHOL 165 09/12/2020   CHOL 188 09/12/2019   CHOL 150 09/08/2018   Lab Results  Component Value Date   HDL 52.20 09/12/2020   HDL 58.00 09/12/2019   HDL 52.70 09/08/2018   Lab Results  Component Value Date  LDLCALC 81 09/12/2020   LDLCALC 101 (H) 09/12/2019   LDLCALC 68 09/08/2018   Lab Results  Component Value Date   TRIG 159.0 (H) 09/12/2020   TRIG 144.0 09/12/2019   TRIG 146.0 09/08/2018   Lab Results  Component Value Date   CHOLHDL 3 09/12/2020   CHOLHDL 3 09/12/2019   CHOLHDL 3 09/08/2018   Lab Results  Component Value Date   LDLDIRECT 143.0 08/27/2015   LDLDIRECT 149.7 01/02/2013   LDLDIRECT 140.7 12/21/2011   Takes atorvastatin 10 mg daily Hard to eat healthy with her schedule  Tries to mt her weight  Eats oatmeal 4 times per week and also high fiber cereal   Other labs Results for  orders placed or performed in visit on 09/12/20  TSH  Result Value Ref Range   TSH 2.63 0.35 - 4.50 uIU/mL  Lipid panel  Result Value Ref Range   Cholesterol 165 0 - 200 mg/dL   Triglycerides 159.0 (H) 0.0 - 149.0 mg/dL   HDL 52.20 >39.00 mg/dL   VLDL 31.8 0.0 - 40.0 mg/dL   LDL Cholesterol 81 0 - 99 mg/dL   Total CHOL/HDL Ratio 3    NonHDL 112.61   Comprehensive metabolic panel  Result Value Ref Range   Sodium 135 135 - 145 mEq/L   Potassium 4.8 3.5 - 5.1 mEq/L   Chloride 101 96 - 112 mEq/L   CO2 29 19 - 32 mEq/L   Glucose, Bld 83 70 - 99 mg/dL   BUN 10 6 - 23 mg/dL   Creatinine, Ser 0.80 0.40 - 1.20 mg/dL   Total Bilirubin 0.7 0.2 - 1.2 mg/dL   Alkaline Phosphatase 73 39 - 117 U/L   AST 21 0 - 37 U/L   ALT 17 0 - 35 U/L   Total Protein 6.9 6.0 - 8.3 g/dL   Albumin 4.5 3.5 - 5.2 g/dL   GFR 72.69 >60.00 mL/min   Calcium 9.8 8.4 - 10.5 mg/dL  CBC with Differential/Platelet  Result Value Ref Range   WBC 6.0 4.0 - 10.5 K/uL   RBC 4.02 3.87 - 5.11 Mil/uL   Hemoglobin 13.1 12.0 - 15.0 g/dL   HCT 38.8 36.0 - 46.0 %   MCV 96.4 78.0 - 100.0 fl   MCHC 33.8 30.0 - 36.0 g/dL   RDW 13.4 11.5 - 15.5 %   Platelets 280.0 150.0 - 400.0 K/uL   Neutrophils Relative % 49.8 43.0 - 77.0 %   Lymphocytes Relative 38.8 12.0 - 46.0 %   Monocytes Relative 8.3 3.0 - 12.0 %   Eosinophils Relative 2.4 0.0 - 5.0 %   Basophils Relative 0.7 0.0 - 3.0 %   Neutro Abs 3.0 1.4 - 7.7 K/uL   Lymphs Abs 2.3 0.7 - 4.0 K/uL   Monocytes Absolute 0.5 0.1 - 1.0 K/uL   Eosinophils Absolute 0.1 0.0 - 0.7 K/uL   Basophils Absolute 0.0 0.0 - 0.1 K/uL    Patient Active Problem List   Diagnosis Date Noted  . Medicare annual wellness visit, initial 09/19/2020  . Early stage nonexudative age-related macular degeneration of both eyes 04/16/2020  . Left epiretinal membrane 04/16/2020  . Pseudophakia, both eyes 04/16/2020  . History of vitrectomy 04/16/2020  . Colon cancer screening 09/09/2017  . History of  shingles 07/25/2017  . Routine general medical examination at a health care facility 08/26/2015  . Osteopenia 08/16/2014  . Estrogen deficiency 06/24/2014  . Irritable bowel syndrome 09/04/2012  . Eczema 12/28/2011  . ALLERGIC RHINITIS  CAUSE UNSPECIFIED 03/26/2010  . HYPERCHOLESTEROLEMIA 08/23/2007   Past Medical History:  Diagnosis Date  . Actinic keratosis   . Hay fever   . History of chicken pox   . HLD (hyperlipidemia)   . Rosacea    Past Surgical History:  Procedure Laterality Date  . ABDOMINAL HYSTERECTOMY    . ANTERIOR AND POSTERIOR REPAIR N/A 07/22/2015   Procedure: ANTERIOR (CYSTOCELE) AND POSTERIOR REPAIR (RECTOCELE);  Surgeon: Bjorn Loser, MD;  Location: [facility];  Service: Urology;  Laterality: N/A;  . CATARACT EXTRACTION W/ INTRAOCULAR LENS IMPLANT Left 06/2017  . CATARACT EXTRACTION W/ INTRAOCULAR LENS IMPLANT Right 07/2017  . COLONOSCOPY  09/20/2007  . CYSTO N/A 07/22/2015   Procedure: CYSTO;  Surgeon: Bjorn Loser, MD;  Location: [facility];  Service: Urology;  Laterality: N/A;  . TUBAL LIGATION  1976  . VAGINAL HYSTERECTOMY N/A 07/22/2015   Procedure: HYSTERECTOMY VAGINAL;  Surgeon: Donnamae Jude, MD;  Location: [facility];  Service: Gynecology;  Laterality: N/A;  Requested 07/22/15 @ 7:30a with Dr. Rogue Bussing to follow   Social History   Tobacco Use  . Smoking status: Never Smoker  . Smokeless tobacco: Never Used  Vaping Use  . Vaping Use: Never used  Substance Use Topics  . Alcohol use: Yes    Alcohol/week: 8.0 standard drinks    Types: 7 Glasses of wine, 1 Shots of liquor per week  . Drug use: No   Family History  Problem Relation Age of Onset  . Pancreatic cancer Mother   . Diabetes Mother   . Cancer Mother        pancreatic  . Lung cancer Father   . Cancer Father        lung  . Stroke Maternal Grandfather   . Breast cancer Sister   . Cancer Sister 88       breast  . Colon cancer Neg Hx    No Known Allergies Current Outpatient Medications  on File Prior to Visit  Medication Sig Dispense Refill  . calcium citrate (CALCITRATE - DOSED IN MG ELEMENTAL CALCIUM) 950 (200 Ca) MG tablet Take 1 tablet by mouth daily.    . cholecalciferol (VITAMIN D) 1000 UNITS tablet Take 1,000 Units by mouth daily.    . Coenzyme Q10 (COQ10 PO) Take 1 tablet by mouth daily.    . fluticasone (FLONASE) 50 MCG/ACT nasal spray Place 2 sprays into both nostrils daily. (Patient taking differently: Place 2 sprays into both nostrils daily as needed.) 16 g 6  . Multiple Vitamins-Minerals (OCUVITE PO) Take 1 tablet by mouth daily.    . Probiotic Product (PROBIOTIC PO) Take 1 capsule by mouth daily.     No current facility-administered medications on file prior to visit.     Review of Systems  Constitutional: Negative for activity change, appetite change, fatigue, fever and unexpected weight change.  HENT: Negative for congestion, ear pain, rhinorrhea, sinus pressure and sore throat.   Eyes: Negative for pain, redness and visual disturbance.  Respiratory: Negative for cough, shortness of breath and wheezing.   Cardiovascular: Negative for chest pain and palpitations.  Gastrointestinal: Negative for abdominal pain, blood in stool, constipation and diarrhea.  Endocrine: Negative for polydipsia and polyuria.  Genitourinary: Negative for dysuria, frequency and urgency.  Musculoskeletal: Negative for arthralgias, back pain and myalgias.  Skin: Negative for pallor and rash.  Allergic/Immunologic: Negative for environmental allergies.  Neurological: Negative for dizziness, syncope and headaches.  Hematological: Negative for adenopathy. Does not bruise/bleed easily.  Psychiatric/Behavioral: Negative  for decreased concentration and dysphoric mood. The patient is not nervous/anxious.        Objective:   Physical Exam Constitutional:      General: She is not in acute distress.    Appearance: Normal appearance. She is well-developed and normal weight. She is not  ill-appearing or diaphoretic.  HENT:     Head: Normocephalic and atraumatic.     Right Ear: Tympanic membrane, ear canal and external ear normal.     Left Ear: Tympanic membrane, ear canal and external ear normal.     Nose: Nose normal. No congestion.     Mouth/Throat:     Mouth: Mucous membranes are moist.     Pharynx: Oropharynx is clear. No posterior oropharyngeal erythema.  Eyes:     General: No scleral icterus.    Extraocular Movements: Extraocular movements intact.     Conjunctiva/sclera: Conjunctivae normal.     Pupils: Pupils are equal, round, and reactive to light.  Neck:     Thyroid: No thyromegaly.     Vascular: No carotid bruit or JVD.  Cardiovascular:     Rate and Rhythm: Normal rate and regular rhythm.     Pulses: Normal pulses.     Heart sounds: Normal heart sounds. No gallop.   Pulmonary:     Effort: Pulmonary effort is normal. No respiratory distress.     Breath sounds: Normal breath sounds. No wheezing.     Comments: Good air exch Chest:     Chest wall: No tenderness.  Abdominal:     General: Bowel sounds are normal. There is no distension or abdominal bruit.     Palpations: Abdomen is soft. There is no mass.     Tenderness: There is no abdominal tenderness.     Hernia: No hernia is present.  Genitourinary:    Comments: Breast exam: No mass, nodules, thickening, tenderness, bulging, retraction, inflamation, nipple discharge or skin changes noted.  No axillary or clavicular LA.     Musculoskeletal:        General: No tenderness. Normal range of motion.     Cervical back: Normal range of motion and neck supple. No rigidity. No muscular tenderness.     Right lower leg: No edema.     Left lower leg: No edema.     Comments: No kyphosis   Lymphadenopathy:     Cervical: No cervical adenopathy.  Skin:    General: Skin is warm and dry.     Coloration: Skin is not pale.     Findings: No erythema or rash.     Comments: Solar lentigines diffusely   Neurological:      Mental Status: She is alert. Mental status is at baseline.     Cranial Nerves: No cranial nerve deficit.     Motor: No abnormal muscle tone.     Coordination: Coordination normal.     Gait: Gait normal.     Deep Tendon Reflexes: Reflexes are normal and symmetric. Reflexes normal.  Psychiatric:        Mood and Affect: Mood normal.        Cognition and Memory: Cognition and memory normal.           Assessment & Plan:   Problem List Items Addressed This Visit      Musculoskeletal and Integument   Osteopenia    dexa rev 3/21 -some further decrease at forearm Taking vit D Active  No falls/fx Trial of alendronate 70 mg weekly  If tolerated plan  5 y  Handout given dexa q 2 y        Other   HYPERCHOLESTEROLEMIA    Disc goals for lipids and reasons to control them Rev last labs with pt Rev low sat fat diet in detail Improved LDL  Plan to continue atorvastatin 10 mg daily        Relevant Medications   atorvastatin (LIPITOR) 10 MG tablet   Routine general medical examination at a health care facility    Reviewed health habits including diet and exercise and skin cancer prevention Reviewed appropriate screening tests for age  Also reviewed health mt list, fam hx and immunization status , as well as social and family history   See [facility] reviewed Plan to start alendronate for osteopenia  No falls/fx Plans to schedule her mammogram Declines covid vaccines/offered to answer questions Given materials to work on adv directive  No cognitive concerns Missed high tones on hearing screen/pt not bothered with any hearing problems Nl vision screen       Medicare annual wellness visit, initial - Primary    Reviewed health habits including diet and exercise and skin cancer prevention Reviewed appropriate screening tests for age  Also reviewed health mt list, fam hx and immunization status , as well as social and family history   See [facility] reviewed Plan to start  alendronate for osteopenia  No falls/fx Plans to schedule her mammogram Declines covid vaccines/offered to answer questions Given materials to work on adv directive  No cognitive concerns Missed high tones on hearing screen/pt not bothered with any hearing problems Nl vision screen

## 2020-09-20 NOTE — Assessment & Plan Note (Signed)
Reviewed health habits including diet and exercise and skin cancer prevention Reviewed appropriate screening tests for age  Also reviewed health mt list, fam hx and immunization status , as well as social and family history   See [facility] reviewed Plan to start alendronate for osteopenia  No falls/fx Plans to schedule her mammogram Declines covid vaccines/offered to answer questions Given materials to work on adv directive  No cognitive concerns Missed high tones on hearing screen/pt not bothered with any hearing problems Nl vision screen

## 2020-09-20 NOTE — Assessment & Plan Note (Signed)
dexa rev 3/21 -some further decrease at forearm Taking vit D Active  No falls/fx Trial of alendronate 70 mg weekly  If tolerated plan 5 y  Handout given dexa q 2 y

## 2020-09-20 NOTE — Assessment & Plan Note (Signed)
Disc goals for lipids and reasons to control them Rev last labs with pt Rev low sat fat diet in detail Improved LDL  Plan to continue atorvastatin 10 mg daily

## 2020-10-07 ENCOUNTER — Other Ambulatory Visit: Payer: Self-pay

## 2020-10-07 ENCOUNTER — Ambulatory Visit: Payer: Medicare Other | Admitting: Dermatology

## 2020-10-07 DIAGNOSIS — L570 Actinic keratosis: Secondary | ICD-10-CM | POA: Diagnosis not present

## 2020-10-07 DIAGNOSIS — L719 Rosacea, unspecified: Secondary | ICD-10-CM | POA: Diagnosis not present

## 2020-10-07 DIAGNOSIS — L578 Other skin changes due to chronic exposure to nonionizing radiation: Secondary | ICD-10-CM

## 2020-10-07 NOTE — Patient Instructions (Addendum)
If you have any questions or concerns for your doctor, please call our main line at [phone] and press option 4 to reach your doctor's medical assistant. If no one answers, please leave a voicemail as directed and we will return your call as soon as possible. Messages left after 4 pm will be answered the following business day.   You may also send us a message via MyChart. We typically respond to MyChart messages within 1-2 business days.  For prescription refills, please ask your pharmacy to contact our office. Our fax number is [phone].  If you have an urgent issue when the clinic is closed that cannot wait until the next business day, you can page your doctor at the number below.    Please note that while we do our best to be available for urgent issues outside of office hours, we are not available 24/7.   If you have an urgent issue and are unable to reach us, you may choose to seek medical care at your doctor's office, retail clinic, urgent care center, or emergency room.  If you have a medical emergency, please immediately call 911 or go to the emergency department.  Pager Numbers  - Dr. Kowalski: [phone]  - Dr. Moye: [phone]  - Dr. Stewart: [phone]  In the event of inclement weather, please call our main line at [phone] for an update on the status of any delays or closures.  Dermatology Medication Tips: Please keep the boxes that topical medications come in in order to help keep track of the instructions about where and how to use these. Pharmacies typically print the medication instructions only on the boxes and not directly on the medication tubes.   If your medication is too expensive, please contact our office at [phone] option 4 or send us a message through MyChart.   We are unable to tell what your co-pay for medications will be in advance as this is different depending on your insurance coverage. However, we may be able to find a substitute  medication at lower cost or fill out paperwork to get insurance to cover a needed medication.   If a prior authorization is required to get your medication covered by your insurance company, please allow us 1-2 business days to complete this process.  Drug prices often vary depending on where the prescription is filled and some pharmacies may offer cheaper prices.  The website www.goodrx.com contains coupons for medications through different pharmacies. The prices here do not account for what the cost may be with help from insurance (it may be cheaper with your insurance), but the website can give you the price if you did not use any insurance.  - You can print the associated coupon and take it with your prescription to the pharmacy.  - You may also stop by our office during regular business hours and pick up a GoodRx coupon card.  - If you need your prescription sent electronically to a different pharmacy, notify our office through  MyChart or by phone at [phone] option 4.  Instructions for [facility] Medications  One or more of your medications was sent to the [facility] mail order compounding pharmacy. You will receive an email from them and can purchase the medicine through that link. It will then be mailed to your home at the address you confirmed. If for any reason you do not receive an email from them, please check your spam folder. If you still do not find the email,   please let us know. [facility] phone number is [phone].   

## 2020-10-07 NOTE — Progress Notes (Signed)
   Follow-Up Visit   Subjective  Chloe Ali is a 75 y.o. female who presents for the following: Actinic Keratosis (Face, 4m f/u PDT with ALA x 2) and Rosacea (Face, [facility] triple cream).   The following portions of the chart were reviewed this encounter and updated as appropriate:       Review of Systems:  No other skin or systemic complaints except as noted in HPI or Assessment and Plan.  Objective  Well appearing patient in no apparent distress; mood and affect are within normal limits.  A focused examination was performed including face. Relevant physical exam findings are noted in the Assessment and Plan.  Objective  face: Mild erythema nose  Objective  R preauricular x 1: Pink scaly macule    Assessment & Plan    Actinic Damage - chronic, secondary to cumulative UV radiation exposure/sun exposure over time - diffuse scaly erythematous macules with underlying dyspigmentation - Recommend daily broad spectrum sunscreen SPF 30+ to sun-exposed areas, reapply every 2 hours as needed.  - Recommend staying in the shade or wearing long sleeves, sun glasses (UVA+UVB protection) and wide brim hats (4-inch brim around the entire circumference of the hat). - Call for new or changing lesions.  Rosacea face  controlled Rosacea is a chronic progressive skin condition usually affecting the face of adults, causing redness and/or acne bumps. It is treatable but not curable. It sometimes affects the eyes (ocular rosacea) as well. It may respond to topical and/or systemic medication and can flare with stress, sun exposure, alcohol, exercise and some foods.  Daily application of broad spectrum spf 30+ sunscreen to face is recommended to reduce flares.  Cont [facility] Triple cream for rosacea qhs (7 refills sent in)  AK (actinic keratosis) R preauricular x 1  Good results from PDT with ALA x 2 to face- overall face much improved  Destruction of lesion - R  preauricular x 1  Destruction method: cryotherapy   Informed consent: discussed and consent obtained   Lesion destroyed using liquid nitrogen: Yes   Region frozen until ice ball extended beyond lesion: Yes   Outcome: patient tolerated procedure well with no complications   Post-procedure details: wound care instructions given    Return in about 6 months (around 04/09/2021) for AK f/u, Rosacea f/u.   I, Chloe Ali, RMA, am acting as scribe for Chloe Patty, MD .  Documentation: I have reviewed the above documentation for accuracy and completeness, and I agree with the above.  Chloe Patty MD

## 2020-10-14 ENCOUNTER — Other Ambulatory Visit: Payer: Self-pay | Admitting: Family Medicine

## 2020-10-14 DIAGNOSIS — Z1231 Encounter for screening mammogram for malignant neoplasm of breast: Secondary | ICD-10-CM

## 2020-12-05 ENCOUNTER — Other Ambulatory Visit: Payer: Self-pay

## 2020-12-05 ENCOUNTER — Ambulatory Visit
Admission: RE | Admit: 2020-12-05 | Discharge: 2020-12-05 | Disposition: A | Discharge status: Home / Self Care | Payer: Medicare Other | Source: Ambulatory Visit | Attending: Family Medicine | Admitting: Family Medicine

## 2020-12-05 DIAGNOSIS — Z1231 Encounter for screening mammogram for malignant neoplasm of breast: Secondary | ICD-10-CM

## 2021-04-21 ENCOUNTER — Ambulatory Visit: Payer: Medicare Other | Admitting: Dermatology

## 2021-04-28 ENCOUNTER — Ambulatory Visit: Payer: Medicare Other | Admitting: Dermatology

## 2021-05-10 ENCOUNTER — Emergency Department (HOSPITAL_BASED_OUTPATIENT_CLINIC_OR_DEPARTMENT_OTHER): Payer: Medicare Other

## 2021-05-10 ENCOUNTER — Emergency Department (HOSPITAL_BASED_OUTPATIENT_CLINIC_OR_DEPARTMENT_OTHER)
Admission: EM | Admit: 2021-05-10 | Discharge: 2021-05-10 | Disposition: A | Discharge status: Home / Self Care | Payer: Medicare Other | Attending: Emergency Medicine | Admitting: Emergency Medicine

## 2021-05-10 ENCOUNTER — Other Ambulatory Visit: Payer: Self-pay

## 2021-05-10 ENCOUNTER — Encounter (HOSPITAL_BASED_OUTPATIENT_CLINIC_OR_DEPARTMENT_OTHER): Payer: Self-pay

## 2021-05-10 DIAGNOSIS — S6991XA Unspecified injury of right wrist, hand and finger(s), initial encounter: Secondary | ICD-10-CM | POA: Diagnosis present

## 2021-05-10 DIAGNOSIS — M25531 Pain in right wrist: Secondary | ICD-10-CM | POA: Diagnosis not present

## 2021-05-10 DIAGNOSIS — M7989 Other specified soft tissue disorders: Secondary | ICD-10-CM | POA: Diagnosis not present

## 2021-05-10 DIAGNOSIS — Y93K9 Activity, other involving animal care: Secondary | ICD-10-CM | POA: Insufficient documentation

## 2021-05-10 DIAGNOSIS — W19XXXA Unspecified fall, initial encounter: Secondary | ICD-10-CM | POA: Insufficient documentation

## 2021-05-10 DIAGNOSIS — S52501A Unspecified fracture of the lower end of right radius, initial encounter for closed fracture: Secondary | ICD-10-CM | POA: Diagnosis not present

## 2021-05-10 MED ORDER — OXYCODONE-ACETAMINOPHEN 5-325 MG PO TABS
1.0000 | ORAL_TABLET | Freq: Once | ORAL | Status: AC
Start: 2021-05-10 — End: 2021-05-10
  Administered 2021-05-10: 1 via ORAL
  Filled 2021-05-10: qty 1

## 2021-05-10 MED ORDER — OXYCODONE-ACETAMINOPHEN 5-325 MG PO TABS
1.0000 | ORAL_TABLET | Freq: Four times a day (QID) | ORAL | 0 refills | Status: AC | PRN
Start: 2021-05-10 — End: 2021-05-13

## 2021-05-10 NOTE — ED Notes (Addendum)
Pt discharged to home. Discharge instructions have been discussed with patient and/or family members. Pt verbally acknowledges understanding d/c instructions, and endorses comprehension to checkout at registration before leaving. Pt instructed regarding splint and s/s to watch for to return for assessment

## 2021-05-10 NOTE — Discharge Instructions (Signed)
Recommend following up with orthopedics regarding your fracture.  For now keep the splint on, keep it clean and dry.  Recommend no weightbearing of your right arm until further instructed by Ortho.  Contact their office on Monday morning to request an appointment this coming week.  For pain control take Tylenol or Motrin as needed.  For breakthrough pain take the prescribed Percocet.  Note this can make you drowsy and should not be taken while driving.

## 2021-05-10 NOTE — ED Notes (Addendum)
Reapply splint d/t patient c/o pain sensation. Pt cap refill <3 after splint redone

## 2021-05-10 NOTE — ED Triage Notes (Signed)
Pt states was chasing her puppy yesterday and lost her balance and fell onto right wrist. C/o wrist pain. Denies any other injuries or hitting head.

## 2021-05-10 NOTE — ED Provider Notes (Addendum)
[facility] EMERGENCY DEPARTMENT Provider Note   CSN: [id number] Arrival date & time: 05/10/21  1000     History Chief Complaint  Patient presents with   Chloe Ali is a 75 y.o. female.  Presents to ER with concern for fall.  Patient reports that she lost her balance while trying to change her puppy and fell forward landing on her right wrist.  Denies any her head, denies loss of consciousness, denies any other injuries except for her right wrist.  Pain is currently mild, worse with movement, aching, improved with rest.  Not on blood thinners.  HPI     Past Medical History:  Diagnosis Date   Actinic keratosis    Hay fever    History of chicken pox    HLD (hyperlipidemia)    Rosacea     Patient Active Problem List   Diagnosis Date Noted   Medicare annual wellness visit, initial 09/19/2020   Early stage nonexudative age-related macular degeneration of both eyes 04/16/2020   Left epiretinal membrane 04/16/2020   Pseudophakia, both eyes 04/16/2020   History of vitrectomy 04/16/2020   Colon cancer screening 09/09/2017   History of shingles 07/25/2017   Routine general medical examination at a health care facility 08/26/2015   Osteopenia 08/16/2014   Estrogen deficiency 06/24/2014   Irritable bowel syndrome 09/04/2012   Eczema 12/28/2011   ALLERGIC RHINITIS CAUSE UNSPECIFIED 03/26/2010   HYPERCHOLESTEROLEMIA 08/23/2007    Past Surgical History:  Procedure Laterality Date   ABDOMINAL HYSTERECTOMY     ANTERIOR AND POSTERIOR REPAIR N/A 07/22/2015   Procedure: ANTERIOR (CYSTOCELE) AND POSTERIOR REPAIR (RECTOCELE);  Surgeon: Bjorn Loser, MD;  Location: [facility];  Service: Urology;  Laterality: N/A;   CATARACT EXTRACTION W/ INTRAOCULAR LENS IMPLANT Left 06/2017   CATARACT EXTRACTION W/ INTRAOCULAR LENS IMPLANT Right 07/2017   COLONOSCOPY  09/20/2007   CYSTO N/A 07/22/2015   Procedure: Kathrene Alu;  Surgeon: Bjorn Loser, MD;  Location: [facility];   Service: Urology;  Laterality: N/A;   TUBAL LIGATION  1976   VAGINAL HYSTERECTOMY N/A 07/22/2015   Procedure: HYSTERECTOMY VAGINAL;  Surgeon: Donnamae Jude, MD;  Location: [facility];  Service: Gynecology;  Laterality: N/A;  Requested 07/22/15 @ 7:30a with Dr. Rogue Bussing to follow     OB History     Gravida  4   Para  4   Term  4   Preterm      AB      Living  4      SAB      IAB      Ectopic      Multiple      Live Births              Family History  Problem Relation Age of Onset   Pancreatic cancer Mother    Diabetes Mother    Cancer Mother        pancreatic   Lung cancer Father    Cancer Father        lung   Stroke Maternal Grandfather    Breast cancer Sister    Cancer Sister 68       breast   Colon cancer Neg Hx     Social History   Tobacco Use   Smoking status: Never   Smokeless tobacco: Never  Vaping Use   Vaping Use: Never used  Substance Use Topics   Alcohol use: Yes    Alcohol/week: 8.0 standard drinks  Types: 7 Glasses of wine, 1 Shots of liquor per week   Drug use: No    Home Medications Prior to Admission medications   Medication Sig Start Date End Date Taking? Authorizing Provider  oxyCODONE-acetaminophen (PERCOCET/ROXICET) 5-325 MG tablet Take 1 tablet by mouth every 6 (six) hours as needed for up to 3 days for severe pain. 05/10/21 05/13/21 Yes Reiss Mowrey, Ellwood Dense, MD  alendronate (FOSAMAX) 70 MG tablet Take 1 tablet (70 mg total) by mouth every 7 (seven) days. Take with a full glass of water on an empty stomach. 09/19/20   Tower, Wynelle Fanny, MD  atorvastatin (LIPITOR) 10 MG tablet Take 1 tablet (10 mg total) by mouth daily. 09/19/20   Tower, Wynelle Fanny, MD  calcium citrate (CALCITRATE - DOSED IN MG ELEMENTAL CALCIUM) 950 (200 Ca) MG tablet Take 1 tablet by mouth daily.    [provider]  cholecalciferol (VITAMIN D) 1000 UNITS tablet Take 1,000 Units by mouth daily.    [provider]  Coenzyme Q10 (COQ10 PO) Take 1  tablet by mouth daily.    [provider]  fluticasone (FLONASE) 50 MCG/ACT nasal spray Place 2 sprays into both nostrils daily. Patient taking differently: Place 2 sprays into both nostrils daily as needed. 05/30/19   Elby Beck, FNP  Multiple Vitamins-Minerals (OCUVITE PO) Take 1 tablet by mouth daily.    [provider]  Probiotic Product (PROBIOTIC PO) Take 1 capsule by mouth daily.    [provider]    Allergies    Patient has no known allergies.  Review of Systems   Review of Systems  Musculoskeletal:  Positive for arthralgias.  All other systems reviewed and are negative.  Physical Exam Updated Vital Signs BP (!) 175/88 (BP Location: Left Arm)   Pulse 78   Temp 97.7 F (36.5 C) (Oral)   Resp 18   Ht 5\' 4"  (1.626 m)   Wt 57.2 kg   SpO2 99%   BMI 21.63 kg/m   Physical Exam Vitals and nursing note reviewed.  Constitutional:      General: She is not in acute distress.    Appearance: She is well-developed.  HENT:     Head: Normocephalic and atraumatic.  Eyes:     Conjunctiva/sclera: Conjunctivae normal.  Cardiovascular:     Rate and Rhythm: Normal rate and regular rhythm.  Pulmonary:     Effort: Pulmonary effort is normal. No respiratory distress.  Musculoskeletal:     Cervical back: Neck supple.     Comments: Right upper extremity: There is tenderness and mild swelling over the wrist, normal radial pulse, normal sensation and motor intact distally, patient range of motion decreased; no tenderness to palpation over entirety of remainder of extremity  Skin:    General: Skin is warm and dry.  Neurological:     Mental Status: She is alert.    ED Results / Procedures / Treatments   Labs (all labs ordered are listed, but only abnormal results are displayed) Labs Reviewed - No data to display  EKG None  Radiology DG Wrist Complete Right  Result Date: 05/10/2021 CLINICAL DATA:  Pt states was chasing her puppy yesterday and  lost her balance and fell onto right wrist. C/o wrist pain. EXAM: RIGHT WRIST - COMPLETE 3+ VIEW COMPARISON:  None. FINDINGS: There is a nondisplaced transverse fracture of the distal radius. The ulna and carpal bones appear intact. No evidence of dislocation. Severe degenerative changes at the first [facility] joint. There is  regional soft tissue swelling. IMPRESSION: Nondisplaced transverse fracture of the distal right radius. Electronically Signed   By: Audie Pinto M.D.   On: 05/10/2021 10:43    Procedures Procedures   Medications Ordered in ED Medications  oxyCODONE-acetaminophen (PERCOCET/ROXICET) 5-325 MG per tablet 1 tablet (1 tablet Oral Given 05/10/21 1114)    ED Course  I have reviewed the triage vital signs and the nursing notes.  Pertinent labs & imaging results that were available during my care of the patient were reviewed by me and considered in my medical decision making (see chart for details).    MDM Rules/Calculators/A&P                           75 year old lady presents to ER with concern for right wrist pain after mechanical fall.  No other associated trauma.  Found to have nondisplaced transverse fracture of distal right radius.  Will place in sugar-tong splint, recommend follow-up with Ortho, nonweightbearing for now.  After the discussed management above, the patient was determined to be safe for discharge.  The patient was in agreement with this plan and all questions regarding their care were answered.  ED return precautions were discussed and the patient will return to the ED with any significant worsening of condition.  Final Clinical Impression(s) / ED Diagnoses Final diagnoses:  Closed fracture of distal end of right radius, unspecified fracture morphology, initial encounter    Rx / DC Orders ED Discharge Orders          Ordered    oxyCODONE-acetaminophen (PERCOCET/ROXICET) 5-325 MG tablet  Every 6 hours PRN        05/10/21 1140              Lucrezia Starch, MD 05/10/21 1148    Lucrezia Starch, MD 05/10/21 1148

## 2021-05-13 DIAGNOSIS — S52501A Unspecified fracture of the lower end of right radius, initial encounter for closed fracture: Secondary | ICD-10-CM | POA: Diagnosis not present

## 2021-05-20 DIAGNOSIS — S52501D Unspecified fracture of the lower end of right radius, subsequent encounter for closed fracture with routine healing: Secondary | ICD-10-CM | POA: Diagnosis not present

## 2021-06-10 DIAGNOSIS — S52501D Unspecified fracture of the lower end of right radius, subsequent encounter for closed fracture with routine healing: Secondary | ICD-10-CM | POA: Diagnosis not present

## 2021-06-17 DIAGNOSIS — H04123 Dry eye syndrome of bilateral lacrimal glands: Secondary | ICD-10-CM | POA: Diagnosis not present

## 2021-06-17 DIAGNOSIS — H43811 Vitreous degeneration, right eye: Secondary | ICD-10-CM | POA: Diagnosis not present

## 2021-06-17 DIAGNOSIS — H35371 Puckering of macula, right eye: Secondary | ICD-10-CM | POA: Diagnosis not present

## 2021-06-17 DIAGNOSIS — Z961 Presence of intraocular lens: Secondary | ICD-10-CM | POA: Diagnosis not present

## 2021-06-17 DIAGNOSIS — H524 Presbyopia: Secondary | ICD-10-CM | POA: Diagnosis not present

## 2021-06-24 DIAGNOSIS — S52501D Unspecified fracture of the lower end of right radius, subsequent encounter for closed fracture with routine healing: Secondary | ICD-10-CM | POA: Diagnosis not present

## 2021-07-01 DIAGNOSIS — G5601 Carpal tunnel syndrome, right upper limb: Secondary | ICD-10-CM | POA: Diagnosis not present

## 2021-07-01 DIAGNOSIS — S52501D Unspecified fracture of the lower end of right radius, subsequent encounter for closed fracture with routine healing: Secondary | ICD-10-CM | POA: Diagnosis not present

## 2021-07-08 ENCOUNTER — Other Ambulatory Visit: Payer: Self-pay

## 2021-07-08 ENCOUNTER — Ambulatory Visit: Payer: Medicare Other | Admitting: Dermatology

## 2021-07-08 DIAGNOSIS — H01139 Eczematous dermatitis of unspecified eye, unspecified eyelid: Secondary | ICD-10-CM | POA: Diagnosis not present

## 2021-07-08 DIAGNOSIS — L259 Unspecified contact dermatitis, unspecified cause: Secondary | ICD-10-CM

## 2021-07-08 MED ORDER — MOMETASONE FUROATE 0.1 % EX OINT
TOPICAL_OINTMENT | CUTANEOUS | 1 refills | Status: AC
Start: 2021-07-08 — End: ?

## 2021-07-08 NOTE — Progress Notes (Deleted)
° °  Follow-Up Visit   Subjective  Chloe Ali is a 75 y.o. female who presents for the following: No chief complaint on file..  ***  The following portions of the chart were reviewed this encounter and updated as appropriate:      {Review of Systems:34166::"No other skin or systemic complaints."}  Objective  Well appearing patient in no apparent distress; mood and affect are within normal limits.  {NTIR:44315::"Q full examination was performed including scalp, head, eyes, ears, nose, lips, neck, chest, axillae, abdomen, back, buttocks, bilateral upper extremities, bilateral lower extremities, hands, feet, fingers, toes, fingernails, and toenails. All findings within normal limits unless otherwise noted below."}   Assessment & Plan   No follow-ups on file.

## 2021-07-08 NOTE — Progress Notes (Signed)
° °  Follow-Up Visit   Subjective  Chloe Ali is a 75 y.o. female who presents for the following: Other (Pt c/o redness and flaking on her eyelids, under eye, and around the nose. ).  The following portions of the chart were reviewed this encounter and updated as appropriate:  Tobacco   Allergies   Meds   Problems   Med Hx   Surg Hx   Fam Hx      Review of Systems: No other skin or systemic complaints except as noted in HPI or Assessment and Plan.  Objective  Well appearing patient in no apparent distress; mood and affect are within normal limits.  A focused examination was performed including face. Relevant physical exam findings are noted in the Assessment and Plan.   Assessment & Plan  Contact dermatitis, with eyelid dermatitis face Recommend Cetaphil gentle liquid cleanser and CeraVe cream moisturizer or lotion  Start Mometasone ointment qd-bid to affected areas until clear.  If not doing well will consider doing patch test at f/u.  Advised not to use anything else on face for the next month.   Topical steroids (such as triamcinolone, fluocinolone, fluocinonide, mometasone, clobetasol, halobetasol, betamethasone, hydrocortisone) can cause thinning and lightening of the skin if they are used for too long in the same area. Your physician has selected the right strength medicine for your problem and area affected on the body. Please use your medication only as directed by your physician to prevent side effects.    mometasone (ELOCON) 0.1 % ointment - face Apply 1-2 times per day to affected areas until clear.  Return in about 1 month (around 08/08/2021) for on a Monday or Tuesday.  For possible patch testing and reevaluation.  IHarriett Sine, CMA, am acting as scribe for Sarina Ser, MD. Documentation: I have reviewed the above documentation for accuracy and completeness, and I agree with the above.  Sarina Ser, MD

## 2021-07-13 ENCOUNTER — Encounter: Payer: Self-pay | Admitting: Dermatology

## 2021-07-22 DIAGNOSIS — G5601 Carpal tunnel syndrome, right upper limb: Secondary | ICD-10-CM | POA: Diagnosis not present

## 2021-07-22 DIAGNOSIS — S52501D Unspecified fracture of the lower end of right radius, subsequent encounter for closed fracture with routine healing: Secondary | ICD-10-CM | POA: Diagnosis not present

## 2021-08-10 ENCOUNTER — Ambulatory Visit: Payer: Medicare Other | Admitting: Dermatology

## 2021-08-10 ENCOUNTER — Other Ambulatory Visit: Payer: Self-pay

## 2021-08-10 DIAGNOSIS — L259 Unspecified contact dermatitis, unspecified cause: Secondary | ICD-10-CM | POA: Diagnosis not present

## 2021-08-10 DIAGNOSIS — L988 Other specified disorders of the skin and subcutaneous tissue: Secondary | ICD-10-CM | POA: Diagnosis not present

## 2021-08-10 NOTE — Progress Notes (Signed)
° °  Follow-Up Visit   Subjective  Chloe Ali is a 76 y.o. female who presents for the following: Follow-up (Patient here today for contact dermatitis at face and eyelids. She was prescribed mometasone to use at affected areas. Patient reports face is not longer red and rash has cleared. Patient would also like to discuss something to tighten area around eyes. ).  The following portions of the chart were reviewed this encounter and updated as appropriate:  Tobacco   Allergies   Meds   Problems   Med Hx   Surg Hx   Fam Hx      Review of Systems: No other skin or systemic complaints except as noted in HPI or Assessment and Plan.  Objective  Well appearing patient in no apparent distress; mood and affect are within normal limits.  A focused examination was performed including face . Relevant physical exam findings are noted in the Assessment and Plan.  face Rhytides and volume loss.   Head - Anterior (Face) Clear at exam    Assessment & Plan  Elastosis of skin face Patient is concerned about crows feet and would like treatment  Discussed botox around eyes at [facility]  10 units at each eye area x $13 = $260 20 units at frown complex = $260 Total = $520 for both areas Patient will scheduled appt  Contact dermatitis, unspecified contact dermatitis type, unspecified trigger Possibly from Poison ivy vine on wood she burns in wood stove Head - Anterior (Face)  Clear today Patient reports she does burn wood for heat and has been in contact with sticks of wood.   Favor poison oak/ivy  Will consider patch test in future if rash comes back  Related Medications mometasone (ELOCON) 0.1 % ointment Apply 1-2 times per day to affected areas until clear.  Return for botox appt for crows feet . IRuthell Rummage, CMA, am acting as scribe for Sarina Ser, MD. Documentation: I have reviewed the above documentation for accuracy and completeness, and I agree with the  above.  Sarina Ser, MD

## 2021-08-10 NOTE — Patient Instructions (Addendum)

## 2021-08-11 ENCOUNTER — Encounter: Payer: Self-pay | Admitting: Dermatology

## 2021-08-21 DIAGNOSIS — G5601 Carpal tunnel syndrome, right upper limb: Secondary | ICD-10-CM | POA: Diagnosis not present

## 2021-09-01 ENCOUNTER — Ambulatory Visit: Payer: Medicare Other | Admitting: Dermatology

## 2021-09-02 ENCOUNTER — Other Ambulatory Visit: Payer: Self-pay | Admitting: Family Medicine

## 2021-09-03 NOTE — Telephone Encounter (Signed)
Called pt and LVM for pt to call back to schedule appt.

## 2021-09-03 NOTE — Telephone Encounter (Signed)
Pt needs a AWV(nurse)/part 2 CPE on or after 09/20/21, please schedule appts and then route back to me to refill med

## 2021-09-08 NOTE — Telephone Encounter (Signed)
2nd attempt to call pt to schedule cpe/lab

## 2021-10-13 DIAGNOSIS — G5601 Carpal tunnel syndrome, right upper limb: Secondary | ICD-10-CM | POA: Diagnosis not present

## 2021-10-26 ENCOUNTER — Ambulatory Visit: Payer: Medicare Other | Admitting: Dermatology

## 2021-10-26 DIAGNOSIS — L719 Rosacea, unspecified: Secondary | ICD-10-CM

## 2021-10-26 DIAGNOSIS — L812 Freckles: Secondary | ICD-10-CM | POA: Diagnosis not present

## 2021-10-26 DIAGNOSIS — L988 Other specified disorders of the skin and subcutaneous tissue: Secondary | ICD-10-CM

## 2021-10-26 DIAGNOSIS — Z872 Personal history of diseases of the skin and subcutaneous tissue: Secondary | ICD-10-CM | POA: Diagnosis not present

## 2021-10-26 DIAGNOSIS — L578 Other skin changes due to chronic exposure to nonionizing radiation: Secondary | ICD-10-CM

## 2021-10-26 NOTE — Patient Instructions (Addendum)
Instructions for [facility] Medications ? ?One or more of your medications was sent to the [facility] mail order compounding pharmacy. You will receive an email from them and can purchase the medicine through that link. It will then be mailed to your home at the address you confirmed. If for any reason you do not receive an email from them, please check your spam folder. If you still do not find the email, please let us know. [facility] phone number is [phone].  ? ? ?If You Need Anything After Your Visit ? ?If you have any questions or concerns for your doctor, please call our main line at [phone] and press option 4 to reach your doctor's medical assistant. If no one answers, please leave a voicemail as directed and we will return your call as soon as possible. Messages left after 4 pm will be answered the following business day.  ? ?You may also send [place] a message via MyChart. We typically respond to MyChart messages within 1-2 business days. ? ?For prescription refills, please ask your pharmacy to contact our office. Our fax number is [phone]. ? ?If you have an urgent issue when the clinic is closed that cannot wait until the next business day, you can page your doctor at the number below.   ? ?Please note that while we do our best to be available for urgent issues outside of office hours, we are not available 24/7.  ? ?If you have an urgent issue and are unable to reach [place], you may choose to seek medical care at your doctor's office, retail clinic, urgent care center, or emergency room. ? ?If you have a medical emergency, please immediately call 911 or go to the emergency department. ? ?Pager Numbers ? ?- Dr. Nehemiah Massed: [phone] ? ?- Dr. Laurence Ferrari: [phone] ? ?- Dr. Nicole Kindred: [phone] ? ?In the event of inclement weather, please call our main line at [phone] for an update on the status of any delays or closures. ? ?Dermatology Medication Tips: ?Please keep the boxes that  topical medications come in in order to help keep track of the instructions about where and how to use these. Pharmacies typically print the medication instructions only on the boxes and not directly on the medication tubes.  ? ?If your medication is too expensive, please contact our office at [phone] option 4 or send [place] a message through [place].  ? ?We are unable to tell what your co-pay for medications will be in advance as this is different depending on your insurance coverage. However, we may be able to find a substitute medication at lower cost or fill out paperwork to get insurance to cover a needed medication.  ? ?If a prior authorization is required to get your medication covered by your insurance company, please allow [place] 1-2 business days to complete this process. ? ?Drug prices often vary depending on where the prescription is filled and some pharmacies may offer cheaper prices. ? ?The website www.goodrx.com contains coupons for medications through different pharmacies. The prices here do not account for what the cost may be with help from insurance (it may be cheaper with your insurance), but the website can give you the price if you did not use any insurance.  ?- You can print the associated coupon and take it with your prescription to the pharmacy.  ?- You may also stop by our office during regular business hours and pick up a GoodRx coupon card.  ?- If you need your prescription  sent electronically to a different pharmacy, notify our office through [facility] or by phone at [phone] option 4. ? ? ? ? ?Si Usted Necesita Algo Despu?s de Su Visita ? ?Tambi?n puede enviarnos un mensaje a trav?s de MyChart. Por lo general respondemos a los mensajes de MyChart en el transcurso de 1 a 2 d?as h?biles. ? ?Para renovar recetas, por favor pida a su farmacia que se ponga en contacto con nuestra oficina. Nuestro n?mero de fax es el [phone]. ? ?Si tiene un asunto urgente cuando la cl?nica  est? cerrada y que no puede esperar hasta el siguiente d?a h?bil, puede llamar/localizar a su doctor(a) al n?mero que aparece a continuaci?n.  ? ?Por favor, tenga en cuenta que aunque hacemos todo lo posible para estar disponibles para asuntos urgentes fuera del horario de oficina, no estamos disponibles las 24 horas del d?a, los 7 d?as de la semana.  ? ?Si tiene un problema urgente y no puede comunicarse con nosotros, puede optar por buscar atenci?n m?dica  en el consultorio de su doctor(a), en una cl?nica privada, en un centro de atenci?n urgente o en una sala de emergencias. ? ?Si tiene Engineer, maintenance (IT) m?dica, por favor llame inmediatamente al 911 o vaya a la sala de emergencias. ? ?N?meros de b?per ? ?- Dr. Nehemiah Massed: [phone] ? ?- Dra. Moye: [phone] ? ?- Dra. Nicole Kindred: [phone] ? ?En caso de inclemencias del tiempo, por favor llame a nuestra l?nea principal al [phone] para una actualizaci?n sobre el estado de cualquier retraso o cierre. ? ?Consejos para la medicaci?n en dermatolog?a: ?Por favor, guarde las cajas en las que vienen los medicamentos de uso t?pico para ayudarle a seguir las instrucciones sobre d?nde y c?mo usarlos. Las farmacias generalmente imprimen las instrucciones del medicamento s?lo en las cajas y no directamente en los tubos del [employer].  ? ?Si su medicamento es muy caro, por favor, p?ngase en contacto con Zigmund Daniel llamando al [phone] y presione la opci?n 4 o env?enos un mensaje a trav?s de MyChart.  ? ?No podemos decirle cu?l ser? su copago por los medicamentos por adelantado ya que esto es diferente dependiendo de la cobertura de su seguro. Sin embargo, es posible que podamos encontrar un medicamento sustituto a Electrical engineer un formulario para que el seguro cubra el medicamento que se considera necesario.  ? ?Si se requiere Ardelia Mems autorizaci?n previa para que su compa??a de seguros [place] su medicamento, por favor perm?tanos de 1 a 2 d?as h?biles para  completar este proceso. ? ?Los precios de los medicamentos var?an con frecuencia dependiendo del Environmental consultant de d?nde se surte la receta y alguna farmacias pueden ofrecer precios m?s baratos. ? ?El sitio web www.goodrx.com tiene cupones para medicamentos de Airline pilot. Los precios aqu? no tienen en cuenta lo que podr?a costar con la ayuda del seguro (puede ser m?s barato con su seguro), pero el sitio web puede darle el precio si no utiliz? ning?n seguro.  ?- Puede imprimir el cup?n correspondiente y llevarlo con su receta a la farmacia.  ?- Tambi?n puede pasar por nuestra oficina durante el horario de atenci?n regular y recoger una tarjeta de cupones de GoodRx.  ?- Si necesita que su receta se env?e electr?nicamente a Chiropodist, informe a nuestra oficina a trav?s de MyChart de [place] o por tel?fono llamando al [phone] y presione la opci?n 4.  ?

## 2021-10-26 NOTE — Progress Notes (Signed)
? ?Follow-Up Visit ?  ?Subjective  ?Chloe Ali is a 76 y.o. female who presents for the following: Actinic Keratosis (Face, >67mf/u), Rosacea (Face, [facility] Triple cream qhs), and Facial Elastosis (Frown complex, crows feet). ? ? ?The following portions of the chart were reviewed this encounter and updated as appropriate:  ?  ?  ? ?Review of Systems:  No other skin or systemic complaints except as noted in HPI or Assessment and Plan. ? ?Objective  ?Well appearing patient in no apparent distress; mood and affect are within normal limits. ? ?A focused examination was performed including face. Relevant physical exam findings are noted in the Assessment and Plan. ? ?face ?Mild erythema nose, malar cheeks ? ?face ?Rhytides and volume loss.  ? ? ? ?Assessment & Plan  ? ?History of PreCancerous Actinic Keratosis  ?- site(s) of PreCancerous Actinic Keratosis clear today. ?- these may recur and new lesions may form requiring treatment to prevent transformation into skin cancer ?- observe for new or changing spots and contact [facility] appointment if occur ?- photoprotection with sun protective clothing; sunglasses and broad spectrum sunscreen with SPF of at least 30 + and frequent self skin exams recommended ?- yearly exams by a dermatologist recommended for persons with history of PreCancerous Actinic Keratoses  ? ?Seborrheic Keratoses ?- Stuck-on, waxy, tan-brown papules including R zygoma area (with erythema)  ?- Benign-appearing ?- Discussed benign etiology and prognosis. ?- Observe, Discussed cryotherapy if spot(s) become irritated or inflamed.  ?- Call for any changes ? ?Actinic Damage ?- chronic, secondary to cumulative UV radiation exposure/sun exposure over time- face ?- diffuse scaly erythematous macules with underlying dyspigmentation ?- Recommend daily broad spectrum sunscreen SPF 30+ to sun-exposed areas, reapply every 2 hours as needed.  ?- Recommend staying in the shade or wearing  long sleeves, sun glasses (UVA+UVB protection) and wide brim hats (4-inch brim around the entire circumference of the hat). ?- Call for new or changing lesions.  ? ? ?Rosacea ?face ? ?Chronic and persistent condition with duration or expected duration over one year. Condition is symptomatic/ bothersome to patient. Not currently at goal.  ? ?Rosacea is a chronic progressive skin condition usually affecting the face of adults, causing redness and/or acne bumps. It is treatable but not curable. It sometimes affects the eyes (ocular rosacea) as well. It may respond to topical and/or systemic medication and can flare with stress, sun exposure, alcohol, exercise and some foods.  Daily application of broad spectrum spf 30+ sunscreen to face is recommended to reduce flares. ? ?Discussed the treatment option of BBL/laser.  Typically we recommend 1-3 treatment sessions about 5-8 weeks apart for best results.  The patient's condition may require "maintenance treatments" in the future.  The fee for BBL / laser treatments is $350 per treatment session for the whole face.  A fee can be quoted for other parts of the body. ?Insurance typically does not pay for BBL/laser treatments and therefore the fee is an out-of-pocket cost. ? ?Discussed Rhofade, not covered ?Start Oxymetazoline: 1%/ Ivermectin: 1% /Niacinamide: 2% Cream qam 30g 6rf (sent to [facility]) ?Cont [facility] Triple cream qhs until finish what she has at home and then she will just use the above in the am ? ? ?Elastosis of skin ?face ? ?Discussed Botox 20-25 units in frown complex, and then 10 units each crow's feet.  Non-covered procedure. Lasts ~3-4 months.  Pt may schedule ? ? ?Return for to be scheduled for Botox. ? ?  I, [name], RMA, am acting as scribe for [name], MD . ? ?Documentation: I have reviewed the above documentation for accuracy and completeness, and I agree with the above. ? ?[name] MD  ? ?

## 2021-10-27 DIAGNOSIS — G5601 Carpal tunnel syndrome, right upper limb: Secondary | ICD-10-CM | POA: Diagnosis not present

## 2021-11-03 ENCOUNTER — Ambulatory Visit: Payer: Medicare Other | Admitting: Dermatology

## 2021-11-05 ENCOUNTER — Telehealth: Payer: Self-pay | Admitting: Family Medicine

## 2021-11-05 DIAGNOSIS — E7800 Pure hypercholesterolemia, unspecified: Secondary | ICD-10-CM

## 2021-11-05 DIAGNOSIS — Z0000 Encounter for general adult medical examination without abnormal findings: Secondary | ICD-10-CM

## 2021-11-05 NOTE — Telephone Encounter (Signed)
-----   Message from Velna Hatchet, RT sent at 10/26/2021 10:49 AM EDT ----- ?Regarding: Lab orders needed for appt on Friday, 11/06/2021 ?Patient is scheduled for cpx, please order future labs.  Thanks, Anda Kraft  ? ?

## 2021-11-06 ENCOUNTER — Other Ambulatory Visit (INDEPENDENT_AMBULATORY_CARE_PROVIDER_SITE_OTHER): Payer: Medicare Other

## 2021-11-06 DIAGNOSIS — E7800 Pure hypercholesterolemia, unspecified: Secondary | ICD-10-CM

## 2021-11-06 DIAGNOSIS — Z0000 Encounter for general adult medical examination without abnormal findings: Secondary | ICD-10-CM

## 2021-11-06 LAB — CBC WITH DIFFERENTIAL/PLATELET
Basophils Absolute: 0.1 10*3/uL (ref 0.0–0.1)
Basophils Relative: 1 % (ref 0.0–3.0)
Eosinophils Absolute: 0.1 10*3/uL (ref 0.0–0.7)
Eosinophils Relative: 2.4 % (ref 0.0–5.0)
HCT: 40.1 % (ref 36.0–46.0)
Hemoglobin: 13.4 g/dL (ref 12.0–15.0)
Lymphocytes Relative: 36.9 % (ref 12.0–46.0)
Lymphs Abs: 2.1 10*3/uL (ref 0.7–4.0)
MCHC: 33.4 g/dL (ref 30.0–36.0)
MCV: 97.9 fl (ref 78.0–100.0)
Monocytes Absolute: 0.4 10*3/uL (ref 0.1–1.0)
Monocytes Relative: 7.9 % (ref 3.0–12.0)
Neutro Abs: 3 10*3/uL (ref 1.4–7.7)
Neutrophils Relative %: 51.8 % (ref 43.0–77.0)
Platelets: 257 10*3/uL (ref 150.0–400.0)
RBC: 4.1 Mil/uL (ref 3.87–5.11)
RDW: 13.4 % (ref 11.5–15.5)
WBC: 5.7 10*3/uL (ref 4.0–10.5)

## 2021-11-06 LAB — COMPREHENSIVE METABOLIC PANEL
ALT: 16 U/L (ref 0–35)
AST: 20 U/L (ref 0–37)
Albumin: 4.5 g/dL (ref 3.5–5.2)
Alkaline Phosphatase: 75 U/L (ref 39–117)
BUN: 11 mg/dL (ref 6–23)
CO2: 28 mEq/L (ref 19–32)
Calcium: 9.6 mg/dL (ref 8.4–10.5)
Chloride: 102 mEq/L (ref 96–112)
Creatinine, Ser: 0.68 mg/dL (ref 0.40–1.20)
GFR: 85.23 mL/min (ref >60.00)
Glucose, Bld: 81 mg/dL (ref 70–99)
Potassium: 4.7 mEq/L (ref 3.5–5.1)
Sodium: 137 mEq/L (ref 135–145)
Total Bilirubin: 0.8 mg/dL (ref 0.2–1.2)
Total Protein: 6.7 g/dL (ref 6.0–8.3)

## 2021-11-06 LAB — LIPID PANEL
Cholesterol: 200 mg/dL (ref 0–200)
HDL: 64.6 mg/dL (ref >39.00)
LDL Cholesterol: 111 mg/dL — ABNORMAL HIGH (ref 0–99)
NonHDL: 135.73
Total CHOL/HDL Ratio: 3
Triglycerides: 123 mg/dL (ref 0.0–149.0)
VLDL: 24.6 mg/dL (ref 0.0–40.0)

## 2021-11-06 LAB — TSH: TSH: 3.51 u[IU]/mL (ref 0.35–5.50)

## 2021-11-10 ENCOUNTER — Encounter: Payer: Self-pay | Admitting: Family Medicine

## 2021-11-10 ENCOUNTER — Ambulatory Visit (INDEPENDENT_AMBULATORY_CARE_PROVIDER_SITE_OTHER): Payer: Medicare Other | Admitting: Family Medicine

## 2021-11-10 VITALS — BP 136/72 | HR 73 | Temp 98.0°F | Ht 63.0 in | Wt 134.8 lb

## 2021-11-10 DIAGNOSIS — Z1231 Encounter for screening mammogram for malignant neoplasm of breast: Secondary | ICD-10-CM | POA: Insufficient documentation

## 2021-11-10 DIAGNOSIS — E7800 Pure hypercholesterolemia, unspecified: Secondary | ICD-10-CM

## 2021-11-10 DIAGNOSIS — Z1211 Encounter for screening for malignant neoplasm of colon: Secondary | ICD-10-CM

## 2021-11-10 DIAGNOSIS — Z0000 Encounter for general adult medical examination without abnormal findings: Secondary | ICD-10-CM | POA: Insufficient documentation

## 2021-11-10 DIAGNOSIS — M8589 Other specified disorders of bone density and structure, multiple sites: Secondary | ICD-10-CM | POA: Diagnosis not present

## 2021-11-10 DIAGNOSIS — E2839 Other primary ovarian failure: Secondary | ICD-10-CM

## 2021-11-10 MED ORDER — ATORVASTATIN CALCIUM 10 MG PO TABS: 10.0000 mg | ORAL_TABLET | Freq: Every day | ORAL | 3 refills | Status: DC

## 2021-11-10 MED ORDER — ALENDRONATE SODIUM 70 MG PO TABS: ORAL_TABLET | ORAL | 3 refills | Status: DC

## 2021-11-10 NOTE — Progress Notes (Signed)
? ?Subjective:  ? ? Patient ID: Chloe Ali, female    DOB: 12-Dec-1945, 76 y.o.   MRN: [id number] ? ?HPI ?Pt presents for amw and health mt visit  ? ?Wt Readings from Last 3 Encounters:  ?11/10/21 134 lb 12 oz (61.1 kg)  ?05/10/21 126 lb (57.2 kg)  ?09/19/20 131 lb 7 oz (59.6 kg)  ? ?23.87 kg/m?  ? ? ?Declines covid vaccines ?Td 12/2011-  postponed for insurance  ?Flu shot- did not get one this year  ?Pna vaccines utd  ?Had shingrix vaccines  ? 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 ? ?Mammogram 11/2020 ?Self breast exam: no lumps  ?Sister had breast cancer ? ?BP Readings from Last 3 Encounters:  ?11/10/21 136/72  ?05/10/21 (!) 184/77  ?09/19/20 128/70  ? ?Pulse Readings from Last 3 Encounters:  ?11/10/21 73  ?05/10/21 71  ?09/19/20 77  ? ? ? ?Hyperlipidemia  ?Lab Results  ?Component Value Date  ? CHOL 200 11/06/2021  ? CHOL 165 09/12/2020  ? CHOL 188 09/12/2019  ? ?Lab Results  ?Component Value Date  ? HDL 64.60 11/06/2021  ? HDL 52.20 09/12/2020  ? HDL 58.00 09/12/2019  ? ?Lab Results  ?Component Value Date  ? LDLCALC 111 (H) 11/06/2021  ? [place] 81 09/12/2020  ? LDLCALC 101 (H) 09/12/2019  ? ?Lab Results  ?Component Value Date  ? TRIG 123.0 11/06/2021  ? TRIG 159.0 (H) 09/12/2020  ? TRIG 144.0 09/12/2019  ? ?Lab Results  ?Component Value Date  ? CHOLHDL 3 11/06/2021  ? CHOLHDL 3 09/12/2020  ? CHOLHDL 3 09/12/2019  ? ?LDL was up just a little  ?Does not always eat right due to busy schedule  ? ?Atorvastatin 10 mg daily  ? ?Other labs ?Results for orders placed or performed in visit on 11/06/21  ?CBC with Differential/Platelet   ?Result Value Ref Range  ? WBC 5.7 4.0 - 10.5 K/uL  ? RBC 4.10 3.87 - 5.11 Mil/uL  ? Hemoglobin 13.4 12.0 - 15.0 g/dL  ? HCT 40.1 36.0 - 46.0 %  ? MCV 97.9 78.0 - 100.0 fl  ? MCHC 33.4 30.0 - 36.0 g/dL  ? RDW 13.4 11.5 - 15.5 %  ? Platelets 257.0 150.0 - 400.0 K/uL  ? Neutrophils Relative % 51.8 43.0 - 77.0 %  ? Lymphocytes Relative 36.9 12.0 - 46.0 %  ? Monocytes Relative 7.9 3.0 - 12.0 %  ? Eosinophils Relative 2.4 0.0 - 5.0 %  ? Basophils Relative 1.0 0.0 - 3.0 %  ? Neutro Abs 3.0 1.4 - 7.7 K/uL  ? Lymphs Abs 2.1 0.7 - 4.0 K/uL  ? Monocytes Absolute 0.4 0.1 - 1.0 K/uL  ? Eosinophils Absolute 0.1 0.0 - 0.7 K/uL  ? Basophils Absolute 0.1 0.0 - 0.1 K/uL  ?Comprehensive metabolic panel  ?Result Value Ref Range  ? Sodium 137 135 - 145 mEq/L  ? Potassium 4.7 3.5 - 5.1 mEq/L  ? Chloride 102 96 - 112 mEq/L  ? CO2 28 19 - 32 mEq/L  ? Glucose, Bld 81 70 - 99  mg/dL  ? BUN 11 6 - 23 mg/dL  ? Creatinine, Ser 0.68 0.40 - 1.20 mg/dL  ? Total Bilirubin 0.8 0.2 - 1.2 mg/dL  ? Alkaline Phosphatase 75 39 - 117 U/L  ? AST 20 0 - 37 U/L  ? ALT 16 0 - 35 U/L  ? Total Protein 6.7 6.0 - 8.3 g/dL  ? Albumin 4.5 3.5 - 5.2 g/dL  ? GFR 85.23 >60.00 mL/min  ? Calcium 9.6 8.4 - 10.5 mg/dL  ?Lipid panel  ?Result Value Ref Range  ? Cholesterol 200 0 - 200 mg/dL  ? Triglycerides 123.0 0.0 - 149.0 mg/dL  ? HDL 64.60 >39.00 mg/dL  ? VLDL 24.6 0.0 - 40.0 mg/dL  ? LDL Cholesterol 111 (H) 0 - 99 mg/dL  ? Total CHOL/HDL Ratio 3   ? NonHDL 135.73   ?TSH  ?Result Value Ref Range  ? TSH 3.51 0.35 - 5.50 uIU/mL  ? ?Hearing Screening  ? '500Hz'$  '1000Hz'$  '2000Hz'$  '4000Hz'$   ?Right ear 40 40 40 0  ?Left ear 40 40 40 0  ?Vision Screening - Comments:: Yearly eye exam with Dr. Katy Fitch ? ?Given materials to work on advance directive  ? ?Care team:  ?Dayan Kreis-pcp ?[facility] ? ? ?ADLS: no help needed  ?Excellent functionality ? ?Patient Active Problem List  ? Diagnosis Date Noted  ? Medicare annual wellness visit, subsequent 11/10/2021  ? Encounter for screening  mammogram for breast cancer 11/10/2021  ? Medicare annual wellness visit, initial 09/19/2020  ? Early stage nonexudative age-related macular degeneration of both eyes 04/16/2020  ? Left epiretinal membrane 04/16/2020  ? Pseudophakia, both eyes 04/16/2020  ? History of vitrectomy 04/16/2020  ? Colon cancer screening 09/09/2017  ? History of shingles 07/25/2017  ? Routine general medical examination at a health care facility 08/26/2015  ? Osteopenia 08/16/2014  ? Estrogen deficiency 06/24/2014  ? Irritable bowel syndrome 09/04/2012  ? Eczema 12/28/2011  ? ALLERGIC RHINITIS CAUSE UNSPECIFIED 03/26/2010  ? HYPERCHOLESTEROLEMIA 08/23/2007  ? ?Past Medical History:  ?Diagnosis Date  ? Actinic keratosis   ? Hay fever   ? History of chicken pox   ? HLD (hyperlipidemia)   ? Rosacea   ? ?Past Surgical History:  ?Procedure Laterality Date  ? ABDOMINAL HYSTERECTOMY    ? ANTERIOR AND POSTERIOR REPAIR N/A 07/22/2015  ? Procedure: ANTERIOR (CYSTOCELE) AND POSTERIOR REPAIR (RECTOCELE);  Surgeon: Bjorn Loser, MD;  Location: [facility];  Service: Urology;  Laterality: N/A;  ? CATARACT EXTRACTION W/ INTRAOCULAR LENS IMPLANT Left 06/2017  ? CATARACT EXTRACTION W/ INTRAOCULAR LENS IMPLANT Right 07/2017  ? COLONOSCOPY  09/20/2007  ? CYSTO N/A 07/22/2015  ? Procedure: CYSTO;  Surgeon: Bjorn Loser, MD;  Location: [facility];  Service: Urology;  Laterality: N/A;  ? TUBAL LIGATION  1976  ? VAGINAL HYSTERECTOMY N/A 07/22/2015  ? Procedure: HYSTERECTOMY VAGINAL;  Surgeon: Donnamae Jude, MD;  Location: [facility];  Service: Gynecology;  Laterality: N/A;  Requested 07/22/15 @ 7:30a with Dr. Rogue Bussing to follow  ? ?Social History  ? ?Tobacco Use  ? Smoking status: Never  ? Smokeless tobacco: Never  ?Vaping Use  ? Vaping Use: Never used  ?Substance Use Topics  ? Alcohol use: Yes  ?  Alcohol/week: 8.0 standard drinks  ?  Types: 7 Glasses of wine, 1 Shots of liquor per week  ? Drug use: No  ? ?Family History  ?Problem Relation Age of Onset  ?  Pancreatic cancer Mother   ? Diabetes Mother   ? Cancer Mother   ?  pancreatic  ? Lung cancer Father   ? Cancer Father   ?     lung  ? Stroke Maternal Grandfather   ? Breast cancer Sister   ? Cancer Sister 66  ?     breast  ? Colon cancer Neg Hx   ? ?No Known Allergies ?Current Outpatient Medications on File Prior to Visit  ?Medication Sig Dispense Refill  ? calcium citrate (CALCITRATE - DOSED IN MG ELEMENTAL CALCIUM) 950 (200 Ca) MG tablet Take 1 tablet by mouth daily.    ? cholecalciferol (VITAMIN D) 1000 UNITS tablet Take 1,000 Units by mouth daily.    ? Coenzyme Q10 (COQ10 PO) Take 1 tablet by mouth daily.    ? fluticasone (FLONASE) 50 MCG/ACT nasal spray Place 2 sprays into both nostrils daily. (Patient taking differently: Place 2 sprays into both nostrils daily as needed.) 16 g 6  ? mometasone (ELOCON) 0.1 % ointment Apply 1-2 times per day to affected areas until clear. 15 g 1  ? Multiple Vitamins-Minerals (OCUVITE PO) Take 1 tablet by mouth daily.    ? Probiotic Product (PROBIOTIC PO) Take 1 capsule by mouth daily.    ? ?No current facility-administered medications on file prior to visit.  ?  ? ?Review of Systems  ?Constitutional:  Negative for activity change, appetite change, fatigue, fever and unexpected weight change.  ?HENT:  Negative for congestion, ear pain, rhinorrhea, sinus pressure and sore throat.   ?Eyes:  Negative for pain, redness and visual disturbance.  ?Respiratory:  Negative for cough, shortness of breath and wheezing.   ?Cardiovascular:  Negative for chest pain and palpitations.  ?Gastrointestinal:  Negative for abdominal pain, blood in stool, constipation and diarrhea.  ?Endocrine: Negative for polydipsia and polyuria.  ?Genitourinary:  Negative for dysuria, frequency and urgency.  ?Musculoskeletal:  Negative for arthralgias, back pain and myalgias.  ?Skin:  Negative for pallor and rash.  ?Allergic/Immunologic: Negative for environmental allergies.  ?Neurological:  Negative for  dizziness, syncope and headaches.  ?Hematological:  Negative for adenopathy. Does not bruise/bleed easily.  ?Psychiatric/Behavioral:  Negative for decreased concentration and dysphoric mood. The patient is not nervous/anxio

## 2021-11-10 NOTE — Assessment & Plan Note (Signed)
Disc goals for lipids and reasons to control them ?Rev last labs with pt ?Rev low sat fat diet in detail ? ?LDL was up slt to 111 ?Given handout re: diet  ?Plan to continue atorvastatin 10 mg daily ?

## 2021-11-10 NOTE — Assessment & Plan Note (Signed)
dexa 2021, ordered today, pt will call to schedule ?Tolerating alendronate 70 mg weekly since 2021 ?One fall with radial fracture in the fall  ?Takes ca and D ?Active but w/o addnl exercise  ?Discussed fall prevention  ? ?

## 2021-11-10 NOTE — Assessment & Plan Note (Signed)
Reviewed health habits including diet and exercise and skin cancer prevention ?Reviewed appropriate screening tests for age  ?Also reviewed health mt list, fam hx and immunization status , as well as social and family history   ?See HPI ?Labs reviewed  ?Missed flu shot, will plan for fall  ?Tetanus shot postponed for insurance  ?dexa and mammogram ordered for May ?One fall and fracture, enc ca and D and exercise  ?Colonoscopy utd from 2019 ?Hearing screen reviewed/wears aides ?utd visoin/eye care  ?No help needed with ADLs ?Very good functionality  ?No cognitive concerns  ?

## 2021-11-10 NOTE — Assessment & Plan Note (Signed)
Routine screening mammogram ordered  ?Pt will schedule for May ?Enc self exams  ?

## 2021-11-10 NOTE — Patient Instructions (Addendum)
If you get cut or scrape or injury you will need a tetanus shot  ? ?Call the [facility] breast center to schedule your bone density test and mammogram  ? ?Please call the location of your choice from the menu below to schedule your Mammogram and/or Bone Density appointment.   ? ?[place]  ? ?[facility]                ?      Phone:  [phone] ?[address]                               ?[place], [place] [address]                                                             ?Services: Traditional and 3D Mammogram, Bone Density  ? ?[facility]           ?      Phone: [phone] ?[address]                                                       ?[place], [place] [address]    ?Service: Bone Density ONLY  ? *this site does NOT perform mammograms ? ?[place]                       ? Phone:  [phone] ?[address]                                  ?[place], [place] [address]                                            ?Services:  3D Mammogram and Bone Density  ? ? ?[place] ? ?[place] [facility]   ?Phone:  [phone]   ?[place], [place] [address]                                            ?Services: 3D Mammogram and Bone Density ? ?[place] [facility])  ?Phone:  [phone]   ?[address]                        ?  [place], [place] [address]                                              ?Services:  3D Mammogram and Bone Density ? ?For cholesterol ?Avoid red meat/ fried foods/ egg yolks/ fatty breakfast meats/ butter, cheese and high fat dairy/ and shellfish  ? ? ?

## 2021-11-10 NOTE — Assessment & Plan Note (Signed)
Colonoscopy utd from 2019  ? ?

## 2021-12-08 ENCOUNTER — Ambulatory Visit: Payer: Medicare Other | Admitting: Dermatology

## 2021-12-10 DIAGNOSIS — G5601 Carpal tunnel syndrome, right upper limb: Secondary | ICD-10-CM | POA: Diagnosis not present

## 2022-01-06 ENCOUNTER — Other Ambulatory Visit: Payer: Medicare Other

## 2022-02-19 ENCOUNTER — Other Ambulatory Visit: Payer: Self-pay

## 2022-02-19 MED ORDER — ALENDRONATE SODIUM 70 MG PO TABS: ORAL_TABLET | ORAL | 3 refills | Status: DC

## 2022-02-19 MED ORDER — ALENDRONATE SODIUM 70 MG PO TABS: 70.0000 mg | ORAL_TABLET | ORAL | 3 refills | Status: DC

## 2022-03-04 ENCOUNTER — Ambulatory Visit
Admission: RE | Admit: 2022-03-04 | Discharge: 2022-03-04 | Disposition: A | Discharge status: Home / Self Care | Payer: Medicare Other | Source: Ambulatory Visit | Attending: Family Medicine | Admitting: Family Medicine

## 2022-03-04 DIAGNOSIS — Z780 Asymptomatic menopausal state: Secondary | ICD-10-CM | POA: Diagnosis not present

## 2022-03-04 DIAGNOSIS — M8589 Other specified disorders of bone density and structure, multiple sites: Secondary | ICD-10-CM | POA: Diagnosis not present

## 2022-03-04 DIAGNOSIS — Z1231 Encounter for screening mammogram for malignant neoplasm of breast: Secondary | ICD-10-CM | POA: Insufficient documentation

## 2022-03-04 DIAGNOSIS — E2839 Other primary ovarian failure: Secondary | ICD-10-CM | POA: Diagnosis not present

## 2022-03-05 ENCOUNTER — Telehealth: Payer: Self-pay

## 2022-03-05 NOTE — Telephone Encounter (Signed)
-----   Message from Abner Greenspan, MD sent at 03/04/2022  6:38 PM EDT ----- Your bone density is fairly stable (up in the spine and down very slightly in the hip) Continue fosamax and supplements  Exercise helps also  We will re check this in 2 years

## 2022-03-05 NOTE — Telephone Encounter (Signed)
I left a message for the patient to return my call.

## 2022-03-08 NOTE — Telephone Encounter (Signed)
Called and spoke with patient regarding her results of dexa and mammogram

## 2022-03-08 NOTE — Telephone Encounter (Signed)
Patient called in returning a call she received. Stated she will be available at 3:30 PM. Thank you!

## 2022-04-19 ENCOUNTER — Encounter (INDEPENDENT_AMBULATORY_CARE_PROVIDER_SITE_OTHER): Payer: Medicare Other | Admitting: Ophthalmology

## 2022-04-19 ENCOUNTER — Ambulatory Visit (INDEPENDENT_AMBULATORY_CARE_PROVIDER_SITE_OTHER): Payer: Medicare Other | Admitting: Ophthalmology

## 2022-04-19 ENCOUNTER — Encounter (INDEPENDENT_AMBULATORY_CARE_PROVIDER_SITE_OTHER): Payer: Self-pay | Admitting: Ophthalmology

## 2022-04-19 DIAGNOSIS — H353131 Nonexudative age-related macular degeneration, bilateral, early dry stage: Secondary | ICD-10-CM | POA: Diagnosis not present

## 2022-04-19 DIAGNOSIS — H35372 Puckering of macula, left eye: Secondary | ICD-10-CM | POA: Diagnosis not present

## 2022-04-19 DIAGNOSIS — Z9889 Other specified postprocedural states: Secondary | ICD-10-CM

## 2022-04-19 NOTE — Assessment & Plan Note (Signed)
No sign of wet AMD OU

## 2022-04-19 NOTE — Progress Notes (Signed)
04/19/2022     CHIEF COMPLAINT Patient presents for  Chief Complaint  Patient presents with   Retina Evaluation      HISTORY OF PRESENT ILLNESS: Chloe Ali is a 76 y.o. female who presents to the clinic today for:   HPI   Age related macular degeneration of both eyes history of epiretinal membrane post vitrectomy membrane peel left eye from 2020 2 year fu ou oct Pt states her vision has been stable Pt denies any new floaters or FOL  Pt states she is having (eye migraines)  Last edited by Hurman Horn, MD on 04/19/2022 11:19 AM.      Referring physician: Tower, Wynelle Fanny, MD [place],  [place] [address]  HISTORICAL INFORMATION:   Selected notes from the [facility]: No current outpatient medications on file. (Ophthalmic Drugs)   No current facility-administered medications for this visit. (Ophthalmic Drugs)   Current Outpatient Medications (Other)  Medication Sig   alendronate (FOSAMAX) 70 MG tablet Pt needs an appt for any future refills (no 90 day Rx given)   alendronate (FOSAMAX) 70 MG tablet Take 1 tablet (70 mg total) by mouth every 7 (seven) days. Take with a full glass of water on an empty stomach.   atorvastatin (LIPITOR) 10 MG tablet Take 1 tablet (10 mg total) by mouth daily.   calcium citrate (CALCITRATE - DOSED IN MG ELEMENTAL CALCIUM) 950 (200 Ca) MG tablet Take 1 tablet by mouth daily.   cholecalciferol (VITAMIN D) 1000 UNITS tablet Take 1,000 Units by mouth daily.   Coenzyme Q10 (COQ10 PO) Take 1 tablet by mouth daily.   fluticasone (FLONASE) 50 MCG/ACT nasal spray Place 2 sprays into both nostrils daily. (Patient taking differently: Place 2 sprays into both nostrils daily as needed.)   mometasone (ELOCON) 0.1 % ointment Apply 1-2 times per day to affected areas until clear.   Multiple Vitamins-Minerals (OCUVITE PO) Take 1 tablet by mouth daily.   Probiotic Product (PROBIOTIC PO) Take 1  capsule by mouth daily.   No current facility-administered medications for this visit. (Other)      REVIEW OF SYSTEMS: ROS   Negative for: Constitutional, Gastrointestinal, Neurological, Skin, Genitourinary, Musculoskeletal, HENT, Endocrine, Cardiovascular, Eyes, Respiratory, Psychiatric, Allergic/Imm, Heme/Lymph Last edited by Morene Rankins, CMA on 04/19/2022 10:05 AM.       ALLERGIES No Known Allergies  PAST MEDICAL HISTORY Past Medical History:  Diagnosis Date   Actinic keratosis    Hay fever    History of chicken pox    HLD (hyperlipidemia)    Rosacea    Past Surgical History:  Procedure Laterality Date   ABDOMINAL HYSTERECTOMY     ANTERIOR AND POSTERIOR REPAIR N/A 07/22/2015   Procedure: ANTERIOR (CYSTOCELE) AND POSTERIOR REPAIR (RECTOCELE);  Surgeon: Bjorn Loser, MD;  Location: [facility];  Service: Urology;  Laterality: N/A;   CATARACT EXTRACTION W/ INTRAOCULAR LENS IMPLANT Left 06/2017   CATARACT EXTRACTION W/ INTRAOCULAR LENS IMPLANT Right 07/2017   COLONOSCOPY  09/20/2007   CYSTO N/A 07/22/2015   Procedure: Kathrene Alu;  Surgeon: Bjorn Loser, MD;  Location: [facility];  Service: Urology;  Laterality: N/A;   TUBAL LIGATION  1976   VAGINAL HYSTERECTOMY N/A 07/22/2015   Procedure: HYSTERECTOMY VAGINAL;  Surgeon: Donnamae Jude, MD;  Location: [facility];  Service: Gynecology;  Laterality: N/A;  Requested 07/22/15 @ 7:30a with Dr. Rogue Bussing to follow    FAMILY HISTORY Family  History  Problem Relation Age of Onset   Pancreatic cancer Mother    Diabetes Mother    Cancer Mother        pancreatic   Lung cancer Father    Cancer Father        lung   Stroke Maternal Grandfather    Breast cancer Sister    Cancer Sister 57       breast   Colon cancer Neg Hx     SOCIAL HISTORY Social History   Tobacco Use   Smoking status: Never   Smokeless tobacco: Never  Vaping Use   Vaping Use: Never used  Substance Use Topics   Alcohol use: Yes    Alcohol/week: 8.0  standard drinks of alcohol    Types: 7 Glasses of wine, 1 Shots of liquor per week   Drug use: No         OPHTHALMIC EXAM:  Base Eye Exam     Visual Acuity (ETDRS)       Right Left   Dist [place] 20/20 -1 20/20         Tonometry (Tonopen, 10:08 AM)       Right Left   Pressure 8 7         Pupils       Pupils   Right PERRL   Left PERRL         Visual Fields       Left Right    Full Full         Extraocular Movement       Right Left    Ortho Ortho    -- -- --  --  --  -- -- --   -- -- --  --  --  -- -- --           Neuro/Psych     Oriented x3: Yes   Mood/Affect: Normal         Dilation     Both eyes: 1.0% Mydriacyl, 2.5% Phenylephrine @ 10:06 AM           Slit Lamp and Fundus Exam     Slit Lamp Exam       Right Left   Lens Posterior chamber intraocular lens Posterior chamber intraocular lens            IMAGING AND PROCEDURES  Imaging and Procedures for 04/19/22  OCT, Retina - OU - Both Eyes       Right Eye Quality was good. Scan locations included subfoveal. Central Foveal Thickness: 274. Progression has been stable. Findings include normal foveal contour, retinal drusen .   Left Eye Quality was good. Scan locations included subfoveal. Central Foveal Thickness: 406. Progression has improved. Findings include abnormal foveal contour.   Notes Posterior vitreous detachment OD, stable OS, post op vitrectomy, ILM peel in 2020, past continued slow improvement in the macular thickening, observe             ASSESSMENT/PLAN:  Early stage nonexudative age-related macular degeneration of both eyes No sign of wet AMD OU     ICD-10-CM   1. Early stage nonexudative age-related macular degeneration of both eyes  H35.3131 OCT, Retina - OU - Both Eyes    2. Left epiretinal membrane  H35.372     3. History of vitrectomy  Z98.890       1.  Patient to report promptly new onset visual acuity distortions or  symptoms  2.  New vision loss.  Follow-up Dr.  Clent Jacks as scheduled  3.  Ophthalmic Meds Ordered this visit:  No orders of the defined types were placed in this encounter.      Return in about 2 years (around 04/19/2024) for DILATE OU, OCT, COLOR FP.  There are no Patient Instructions on file for this visit.   Explained the diagnoses, plan, and follow up with the patient and they expressed understanding.  Patient expressed understanding of the importance of proper follow up care.   Clent Demark Lynk Marti M.D. Diseases & Surgery of the Retina and Vitreous [facility]ina [facility] Di[facility] [place] 04/19/22     Abbreviations: M myopia (nearsighted); A astigmatism; H hyperopia (farsighted); P presbyopia; Mrx spectacle prescription;  CTL contact lenses; OD right eye; OS left eye; OU both eyes  XT exotropia; ET esotropia; PEK punctate epithelial keratitis; PEE punctate epithelial erosions; DES dry eye syndrome; MGD meibomian gland dysfunction; ATs artificial tears; PFAT's preservative free artificial tears; Baldwin nuclear sclerotic cataract; PSC posterior subcapsular cataract; ERM epi-retinal membrane; PVD posterior vitreous detachment; RD retinal detachment; DM diabetes mellitus; DR diabetic retinopathy; NPDR non-proliferative diabetic retinopathy; PDR proliferative diabetic retinopathy; CSME clinically significant macular edema; DME diabetic macular edema; dbh dot blot hemorrhages; CWS cotton wool spot; POAG primary open angle glaucoma; C/D cup-to-disc ratio; HVF humphrey visual field; GVF goldmann visual field; OCT optical coherence tomography; IOP intraocular pressure; BRVO Branch retinal vein occlusion; CRVO central retinal vein occlusion; CRAO central retinal artery occlusion; BRAO branch retinal artery occlusion; RT retinal tear; SB scleral buckle; PPV pars plana vitrectomy; VH Vitreous hemorrhage; PRP panretinal laser photocoagulation; IVK intravitreal kenalog; VMT vitreomacular traction; MH Macular hole;   NVD neovascularization of the disc; NVE neovascularization elsewhere; AREDS age related eye disease study; ARMD age related macular degeneration; POAG primary open angle glaucoma; EBMD epithelial/anterior basement membrane dystrophy; ACIOL anterior chamber intraocular lens; IOL intraocular lens; PCIOL posterior chamber intraocular lens; Phaco/IOL phacoemulsification with intraocular lens placement; Stanley photorefractive keratectomy; LASIK laser assisted in situ keratomileusis; HTN hypertension; DM diabetes mellitus; COPD chronic obstructive pulmonary disease

## 2022-05-26 DIAGNOSIS — M5431 Sciatica, right side: Secondary | ICD-10-CM | POA: Diagnosis not present

## 2022-05-26 DIAGNOSIS — M25552 Pain in left hip: Secondary | ICD-10-CM | POA: Diagnosis not present

## 2022-05-26 DIAGNOSIS — M25551 Pain in right hip: Secondary | ICD-10-CM | POA: Diagnosis not present

## 2022-08-20 DIAGNOSIS — M25561 Pain in right knee: Secondary | ICD-10-CM | POA: Diagnosis not present

## 2022-08-20 DIAGNOSIS — M25562 Pain in left knee: Secondary | ICD-10-CM | POA: Diagnosis not present

## 2022-09-29 ENCOUNTER — Telehealth: Payer: Self-pay | Admitting: Family Medicine

## 2022-09-29 NOTE — Telephone Encounter (Signed)
Called patient to schedule Medicare Annual Wellness Visit (AWV). Left message for patient to call back and schedule Medicare Annual Wellness Visit (AWV).  Last date of AWV: 11/11/2022  Please schedule an appointment at any time with [facility].  If any questions, please contact me at [phone].  Thank you ,  [facility] Direct Dial: [phone]

## 2022-11-02 ENCOUNTER — Telehealth: Payer: Self-pay | Admitting: Family Medicine

## 2022-11-02 DIAGNOSIS — M8589 Other specified disorders of bone density and structure, multiple sites: Secondary | ICD-10-CM

## 2022-11-02 DIAGNOSIS — Z131 Encounter for screening for diabetes mellitus: Secondary | ICD-10-CM

## 2022-11-02 DIAGNOSIS — Z0000 Encounter for general adult medical examination without abnormal findings: Secondary | ICD-10-CM

## 2022-11-02 DIAGNOSIS — E7800 Pure hypercholesterolemia, unspecified: Secondary | ICD-10-CM

## 2022-11-02 NOTE — Telephone Encounter (Signed)
-----   Message from Alvina Chou sent at 10/28/2022 10:28 AM EDT ----- Regarding: Lab orders for Friday, 4.12.24 Patient is scheduled for CPX labs, please order future labs, Thanks , Camelia Eng

## 2022-11-05 ENCOUNTER — Other Ambulatory Visit (INDEPENDENT_AMBULATORY_CARE_PROVIDER_SITE_OTHER): Payer: Medicare Other

## 2022-11-05 DIAGNOSIS — Z0000 Encounter for general adult medical examination without abnormal findings: Secondary | ICD-10-CM | POA: Diagnosis not present

## 2022-11-05 DIAGNOSIS — Z131 Encounter for screening for diabetes mellitus: Secondary | ICD-10-CM

## 2022-11-05 DIAGNOSIS — E7800 Pure hypercholesterolemia, unspecified: Secondary | ICD-10-CM

## 2022-11-05 DIAGNOSIS — M8589 Other specified disorders of bone density and structure, multiple sites: Secondary | ICD-10-CM | POA: Diagnosis not present

## 2022-11-05 LAB — LIPID PANEL
Cholesterol: 166 mg/dL (ref 0–200)
HDL: 62.3 mg/dL (ref >39.00)
LDL Cholesterol: 74 mg/dL (ref 0–99)
NonHDL: 104.19
Total CHOL/HDL Ratio: 3
Triglycerides: 152 mg/dL — ABNORMAL HIGH (ref 0.0–149.0)
VLDL: 30.4 mg/dL (ref 0.0–40.0)

## 2022-11-05 LAB — COMPREHENSIVE METABOLIC PANEL
ALT: 13 U/L (ref 0–35)
AST: 17 U/L (ref 0–37)
Albumin: 4.6 g/dL (ref 3.5–5.2)
Alkaline Phosphatase: 80 U/L (ref 39–117)
BUN: 14 mg/dL (ref 6–23)
CO2: 27 mEq/L (ref 19–32)
Calcium: 10 mg/dL (ref 8.4–10.5)
Chloride: 103 mEq/L (ref 96–112)
Creatinine, Ser: 0.79 mg/dL (ref 0.40–1.20)
GFR: 72.69 mL/min (ref >60.00)
Glucose, Bld: 91 mg/dL (ref 70–99)
Potassium: 4.6 mEq/L (ref 3.5–5.1)
Sodium: 139 mEq/L (ref 135–145)
Total Bilirubin: 0.6 mg/dL (ref 0.2–1.2)
Total Protein: 6.7 g/dL (ref 6.0–8.3)

## 2022-11-05 LAB — CBC WITH DIFFERENTIAL/PLATELET
Basophils Absolute: 0 10*3/uL (ref 0.0–0.1)
Basophils Relative: 0.7 % (ref 0.0–3.0)
Eosinophils Absolute: 0.2 10*3/uL (ref 0.0–0.7)
Eosinophils Relative: 2.6 % (ref 0.0–5.0)
HCT: 39.8 % (ref 36.0–46.0)
Hemoglobin: 13.6 g/dL (ref 12.0–15.0)
Lymphocytes Relative: 38.7 % (ref 12.0–46.0)
Lymphs Abs: 2.3 10*3/uL (ref 0.7–4.0)
MCHC: 34.2 g/dL (ref 30.0–36.0)
MCV: 97.3 fl (ref 78.0–100.0)
Monocytes Absolute: 0.5 10*3/uL (ref 0.1–1.0)
Monocytes Relative: 9.2 % (ref 3.0–12.0)
Neutro Abs: 2.9 10*3/uL (ref 1.4–7.7)
Neutrophils Relative %: 48.8 % (ref 43.0–77.0)
Platelets: 282 10*3/uL (ref 150.0–400.0)
RBC: 4.09 Mil/uL (ref 3.87–5.11)
RDW: 12 % (ref 11.5–15.5)
WBC: 5.9 10*3/uL (ref 4.0–10.5)

## 2022-11-05 LAB — HEMOGLOBIN A1C: Hgb A1c MFr Bld: 5.5 % (ref 4.6–6.5)

## 2022-11-05 LAB — TSH: TSH: 3.08 u[IU]/mL (ref 0.35–5.50)

## 2022-11-12 ENCOUNTER — Encounter: Payer: Self-pay | Admitting: Family Medicine

## 2022-11-12 ENCOUNTER — Ambulatory Visit (INDEPENDENT_AMBULATORY_CARE_PROVIDER_SITE_OTHER): Payer: Medicare Other | Admitting: Family Medicine

## 2022-11-12 VITALS — BP 130/74 | HR 70 | Temp 97.2°F | Ht 63.0 in | Wt 125.0 lb

## 2022-11-12 DIAGNOSIS — M8589 Other specified disorders of bone density and structure, multiple sites: Secondary | ICD-10-CM

## 2022-11-12 DIAGNOSIS — Z0000 Encounter for general adult medical examination without abnormal findings: Secondary | ICD-10-CM

## 2022-11-12 DIAGNOSIS — Z1211 Encounter for screening for malignant neoplasm of colon: Secondary | ICD-10-CM | POA: Diagnosis not present

## 2022-11-12 DIAGNOSIS — Z131 Encounter for screening for diabetes mellitus: Secondary | ICD-10-CM | POA: Diagnosis not present

## 2022-11-12 DIAGNOSIS — E7800 Pure hypercholesterolemia, unspecified: Secondary | ICD-10-CM

## 2022-11-12 MED ORDER — ATORVASTATIN CALCIUM 10 MG PO TABS: 10.0000 mg | ORAL_TABLET | Freq: Every day | ORAL | 3 refills | Status: DC

## 2022-11-12 NOTE — Assessment & Plan Note (Signed)
Lab Results  Component Value Date   HGBA1C 5.5 11/05/2022   Good Enc low glycemic diet with protein and produce

## 2022-11-12 NOTE — Assessment & Plan Note (Signed)
Dexa 02/2022  Osteopenia mixed trend  Alendronate since 2021- plan on 5 y course  No falls or fx On ca and D  Discussed adding some strength training

## 2022-11-12 NOTE — Patient Instructions (Addendum)
Make sure you get protein with meals and snacks   Stay active Add some strength training to your routine, this is important for bone and brain health and can reduce your risk of falls and help your body use insulin properly and regulate weight  Light weights, exercise bands , and internet videos are a good way to start  Yoga (chair or regular), machines , floor exercises or a gym with machines are also good options     Take care of yourself  Stay hydrated working outside   You are due for a tetanus shot I think  You can get that at a pharmacy or health dept

## 2022-11-12 NOTE — Assessment & Plan Note (Addendum)
Disc goals for lipids and reasons to control them Rev last labs with pt Rev low sat fat diet in detail   Taking atorvastatin 10 mg daily  Also co Q 10  Good diet

## 2022-11-12 NOTE — Assessment & Plan Note (Signed)
Reviewed health habits including diet and exercise and skin cancer prevention Reviewed appropriate screening tests for age  Also reviewed health mt list, fam hx and immunization status , as well as social and family history   See HPI Labs reviewed and ordered Good exercise  Mammogram utd 02/2022 Colonoscopy utd 2019 Dexa utd 02/2022   no falls or fx on alendronate  Nl DM screen

## 2022-11-12 NOTE — Assessment & Plan Note (Signed)
Colonoscopy it utd from 2019

## 2022-11-12 NOTE — Progress Notes (Signed)
Subjective:    Patient ID: Chloe Ali, female    DOB: 1945-11-29, 77 y.o.   MRN: [id number]  HPI Here for health maintenance exam and to review chronic medical problems    Wt Readings from Last 3 Encounters:  11/12/22 125 lb (56.7 kg)  11/10/21 134 lb 12 oz (61.1 kg)  05/10/21 126 lb (57.2 kg)   22.14 kg/m  Wants to loose weight in mid section   Immunization History  Administered Date(s) Administered   Fluad Quad(high Dose 65+) 04/26/2019   Influenza Split 07/29/2011, 06/15/2012   Influenza, High Dose Seasonal PF 05/02/2017, 05/26/2020   Influenza,inj,Quad PF,6+ Mos 06/24/2014, 09/03/2015, 06/10/2016   Influenza-Unspecified 05/09/2018   Pneumococcal Conjugate-13 06/24/2014   Pneumococcal Polysaccharide-23 12/28/2011   Td 12/28/2011   Zoster Recombinat (Shingrix) 12/25/2017, 03/09/2018   Health Maintenance Due  Topic Date Due   DTaP/Tdap/Td (2 - Tdap) 12/27/2021   Medicare Annual Wellness (AWV)  11/11/2022   Very busy- likes that  Works from 4 to 10 pm  Feeling ok   Has one glass of wine per day   Exercise Very active Has a routine - upper body / stretches Some abd exercises  Some standing leg lifts and some work with chair /squats    Is taking care of herself  Finds time for herself  Lost some weight  Does not have time for meals-has to grab bites  Eats bkast, snack at mid day Has a few bites in the evening    Mammogram 02/2022 Sister had breast cancer  Self breast exam: no lumps   Mother had pancreatic cancer   Colon cancer screening -colonoscopy 2019   Dexa  02/2022  osteopenia mixed trend Taking alendronate since 2021  Falls: none Fractures:none  Supplements  ca and D Exercise : mixed  some weight bearing    DM screening Lab Results  Component Value Date   HGBA1C 5.5 11/05/2022   Hyperlipidemia Lab Results  Component Value Date   CHOL 166 11/05/2022   CHOL 200 11/06/2021   CHOL 165 09/12/2020   Lab Results  Component Value  Date   HDL 62.30 11/05/2022   HDL 64.60 11/06/2021   HDL 52.20 09/12/2020   Lab Results  Component Value Date   LDLCALC 74 11/05/2022   LDLCALC 111 (H) 11/06/2021   LDLCALC 81 09/12/2020   Lab Results  Component Value Date   TRIG 152.0 (H) 11/05/2022   TRIG 123.0 11/06/2021   TRIG 159.0 (H) 09/12/2020   Lab Results  Component Value Date   CHOLHDL 3 11/05/2022   CHOLHDL 3 11/06/2021   CHOLHDL 3 09/12/2020   Lab Results  Component Value Date   LDLDIRECT 143.0 08/27/2015   LDLDIRECT 149.7 01/02/2013   LDLDIRECT 140.7 12/21/2011   Atorvastatin 10 mg daily   Takes some co Q 10 now also   Last metabolic panel Lab Results  Component Value Date   GLUCOSE 91 11/05/2022   NA 139 11/05/2022   K 4.6 11/05/2022   CL 103 11/05/2022   CO2 27 11/05/2022   BUN 14 11/05/2022   CREATININE 0.79 11/05/2022   GFRNONAA >60 07/08/2015   CALCIUM 10.0 11/05/2022   PROT 6.7 11/05/2022   ALBUMIN 4.6 11/05/2022   BILITOT 0.6 11/05/2022   ALKPHOS 80 11/05/2022   AST 17 11/05/2022   ALT 13 11/05/2022   ANIONGAP 7 07/08/2015   GFR of 72 Lab Results  Component Value Date   WBC 5.9 11/05/2022   HGB 13.6 11/05/2022  HCT 39.8 11/05/2022   MCV 97.3 11/05/2022   PLT 282.0 11/05/2022   Lab Results  Component Value Date   TSH 3.08 11/05/2022   Patient Active Problem List   Diagnosis Date Noted   Diabetes mellitus screening 11/02/2022   Medicare annual wellness visit, subsequent 11/10/2021   Encounter for screening mammogram for breast cancer 11/10/2021   Medicare annual wellness visit, initial 09/19/2020   Early stage nonexudative age-related macular degeneration of both eyes 04/16/2020   Left epiretinal membrane 04/16/2020   Pseudophakia, both eyes 04/16/2020   History of vitrectomy 04/16/2020   Colon cancer screening 09/09/2017   History of shingles 07/25/2017   Routine general medical examination at a health care facility 08/26/2015   Osteopenia 08/16/2014   Estrogen  deficiency 06/24/2014   Irritable bowel syndrome 09/04/2012   Eczema 12/28/2011   ALLERGIC RHINITIS CAUSE UNSPECIFIED 03/26/2010   HYPERCHOLESTEROLEMIA 08/23/2007   Past Medical History:  Diagnosis Date   Actinic keratosis    Hay fever    History of chicken pox    HLD (hyperlipidemia)    Rosacea    Past Surgical History:  Procedure Laterality Date   ABDOMINAL HYSTERECTOMY     ANTERIOR AND POSTERIOR REPAIR N/A 07/22/2015   Procedure: ANTERIOR (CYSTOCELE) AND POSTERIOR REPAIR (RECTOCELE);  Surgeon: Alfredo Martinez, MD;  Location: [facility];  Service: Urology;  Laterality: N/A;   CATARACT EXTRACTION W/ INTRAOCULAR LENS IMPLANT Left 06/2017   CATARACT EXTRACTION W/ INTRAOCULAR LENS IMPLANT Right 07/2017   COLONOSCOPY  09/20/2007   CYSTO N/A 07/22/2015   Procedure: Clearnce Sorrel;  Surgeon: Alfredo Martinez, MD;  Location: [facility];  Service: Urology;  Laterality: N/A;   TUBAL LIGATION  1976   VAGINAL HYSTERECTOMY N/A 07/22/2015   Procedure: HYSTERECTOMY VAGINAL;  Surgeon: Reva Bores, MD;  Location: [facility];  Service: Gynecology;  Laterality: N/A;  Requested 07/22/15 @ 7:30a with Dr. Caryl Pina to follow   Social History   Tobacco Use   Smoking status: Never   Smokeless tobacco: Never  Vaping Use   Vaping Use: Never used  Substance Use Topics   Alcohol use: Yes    Alcohol/week: 8.0 standard drinks of alcohol    Types: 7 Glasses of wine, 1 Shots of liquor per week   Drug use: No   Family History  Problem Relation Age of Onset   Pancreatic cancer Mother    Diabetes Mother    Cancer Mother        pancreatic   Lung cancer Father    Cancer Father        lung   Stroke Maternal Grandfather    Breast cancer Sister    Cancer Sister 43       breast   Colon cancer Neg Hx    No Known Allergies Current Outpatient Medications on File Prior to Visit  Medication Sig Dispense Refill   alendronate (FOSAMAX) 70 MG tablet Take 1 tablet (70 mg total) by mouth every 7 (seven) days. Take with a  full glass of water on an empty stomach. 12 tablet 3   calcium citrate (CALCITRATE - DOSED IN MG ELEMENTAL CALCIUM) 950 (200 Ca) MG tablet Take 1 tablet by mouth daily.     cholecalciferol (VITAMIN D) 1000 UNITS tablet Take 1,000 Units by mouth daily.     Coenzyme Q10 (COQ10 PO) Take 1 tablet by mouth daily.     fluticasone (FLONASE) 50 MCG/ACT nasal spray Place 2 sprays into both nostrils daily. (Patient taking differently: Place 2 sprays  into both nostrils daily as needed.) 16 g 6   meloxicam (MOBIC) 15 MG tablet Take by mouth.     Multiple Vitamins-Minerals (OCUVITE PO) Take 1 tablet by mouth daily.     Probiotic Product (PROBIOTIC PO) Take 1 capsule by mouth daily.     mometasone (ELOCON) 0.1 % ointment Apply 1-2 times per day to affected areas until clear. (Patient not taking: Reported on 11/12/2022) 15 g 1   No current facility-administered medications on file prior to visit.     Review of Systems  Constitutional:  Negative for activity change, appetite change, fatigue, fever and unexpected weight change.  HENT:  Negative for congestion, ear pain, rhinorrhea, sinus pressure and sore throat.   Eyes:  Negative for pain, redness and visual disturbance.  Respiratory:  Negative for cough, shortness of breath and wheezing.   Cardiovascular:  Negative for chest pain and palpitations.  Gastrointestinal:  Negative for abdominal pain, blood in stool, constipation and diarrhea.  Endocrine: Negative for polydipsia and polyuria.  Genitourinary:  Negative for dysuria, frequency and urgency.  Musculoskeletal:  Negative for arthralgias, back pain and myalgias.  Skin:  Negative for pallor and rash.  Allergic/Immunologic: Negative for environmental allergies.  Neurological:  Negative for dizziness, syncope and headaches.  Hematological:  Negative for adenopathy. Does not bruise/bleed easily.  Psychiatric/Behavioral:  Negative for decreased concentration and dysphoric mood. The patient is not  nervous/anxious.        Very busy schedule       Objective:   Physical Exam Constitutional:      General: She is not in acute distress.    Appearance: Normal appearance. She is well-developed and normal weight. She is not ill-appearing or diaphoretic.  HENT:     Head: Normocephalic and atraumatic.     Right Ear: Tympanic membrane, ear canal and external ear normal.     Left Ear: Tympanic membrane, ear canal and external ear normal.     Nose: Nose normal. No congestion.     Mouth/Throat:     Mouth: Mucous membranes are moist.     Pharynx: Oropharynx is clear. No posterior oropharyngeal erythema.  Eyes:     General: No scleral icterus.    Extraocular Movements: Extraocular movements intact.     Conjunctiva/sclera: Conjunctivae normal.     Pupils: Pupils are equal, round, and reactive to light.  Neck:     Thyroid: No thyromegaly.     Vascular: No carotid bruit or JVD.  Cardiovascular:     Rate and Rhythm: Normal rate and regular rhythm.     Pulses: Normal pulses.     Heart sounds: Normal heart sounds.     No gallop.  Pulmonary:     Effort: Pulmonary effort is normal. No respiratory distress.     Breath sounds: Normal breath sounds. No wheezing.     Comments: Good air exch Chest:     Chest wall: No tenderness.  Abdominal:     General: Bowel sounds are normal. There is no distension or abdominal bruit.     Palpations: Abdomen is soft. There is no mass.     Tenderness: There is no abdominal tenderness.     Hernia: No hernia is present.  Genitourinary:    Comments: Breast exam: No mass, nodules, thickening, tenderness, bulging, retraction, inflamation, nipple discharge or skin changes noted.  No axillary or clavicular LA.     Musculoskeletal:        General: No tenderness. Normal range of motion.  Cervical back: Normal range of motion and neck supple. No rigidity. No muscular tenderness.     Right lower leg: No edema.     Left lower leg: No edema.     Comments: No  kyphosis   Lymphadenopathy:     Cervical: No cervical adenopathy.  Skin:    General: Skin is warm and dry.     Coloration: Skin is not pale.     Findings: No erythema or rash.     Comments: Solar lentigines diffusely   Neurological:     Mental Status: She is alert. Mental status is at baseline.     Cranial Nerves: No cranial nerve deficit.     Motor: No abnormal muscle tone.     Coordination: Coordination normal.     Gait: Gait normal.     Deep Tendon Reflexes: Reflexes are normal and symmetric. Reflexes normal.  Psychiatric:        Mood and Affect: Mood normal.        Cognition and Memory: Cognition and memory normal.     Comments: Pleasant Talkative            Assessment & Plan:   Problem List Items Addressed This Visit       Musculoskeletal and Integument   Osteopenia    Dexa 02/2022  Osteopenia mixed trend  Alendronate since 2021- plan on 5 y course  No falls or fx On ca and D  Discussed adding some strength training          Other   Colon cancer screening    Colonoscopy it utd from 2019       Diabetes mellitus screening    Lab Results  Component Value Date   HGBA1C 5.5 11/05/2022  Good Enc low glycemic diet with protein and produce       HYPERCHOLESTEROLEMIA    Disc goals for lipids and reasons to control them Rev last labs with pt Rev low sat fat diet in detail   Taking atorvastatin 10 mg daily  Also co Q 10  Good diet        Relevant Medications   atorvastatin (LIPITOR) 10 MG tablet   Routine general medical examination at a health care facility - Primary    Reviewed health habits including diet and exercise and skin cancer prevention Reviewed appropriate screening tests for age  Also reviewed health mt list, fam hx and immunization status , as well as social and family history   See HPI Labs reviewed and ordered Good exercise  Mammogram utd 02/2022 Colonoscopy utd 2019 Dexa utd 02/2022   no falls or fx on alendronate  Nl DM  screen

## 2023-02-03 ENCOUNTER — Ambulatory Visit (INDEPENDENT_AMBULATORY_CARE_PROVIDER_SITE_OTHER): Payer: Medicare Other

## 2023-02-03 VITALS — Ht 64.0 in | Wt 124.0 lb

## 2023-02-03 DIAGNOSIS — Z0000 Encounter for general adult medical examination without abnormal findings: Secondary | ICD-10-CM | POA: Diagnosis not present

## 2023-02-03 DIAGNOSIS — Z1231 Encounter for screening mammogram for malignant neoplasm of breast: Secondary | ICD-10-CM

## 2023-02-03 NOTE — Patient Instructions (Addendum)
[name] , Thank you for taking time to come for your Medicare Wellness Visit. I appreciate your ongoing commitment to your health goals. Please review the following plan we discussed and let me know if I can assist you in the future.   These are the goals we discussed:  Goals      Increase physical activity     Starting 09/08/2018, I will continue to walk for 20 minutes 6 days per week.      Patient Stated     09/11/2019, I will continue to walk 3-4 times a week for about 30 minutes.      Patient Stated     To keep working and stay healthy        This is a list of the screening recommended for you and due dates:  Health Maintenance  Topic Date Due   COVID-19 Vaccine (1) Never done   DTaP/Tdap/Td vaccine (2 - Tdap) 12/27/2021   Flu Shot  02/24/2023   Mammogram  03/05/2023   Medicare Annual Wellness Visit  02/03/2024   Pneumonia Vaccine  Completed   DEXA scan (bone density measurement)  Completed   Hepatitis C Screening  Completed   Zoster (Shingles) Vaccine  Completed   HPV Vaccine  Aged Out    Advanced directives: Advance directive discussed with you today. Even though you declined this today, please call our office should you change your mind, and we can give you the proper paperwork for you to fill out. Advance care planning is a way to make decisions about medical care that fits your values in case you are ever unable to make these decisions for yourself.  Information on Advanced Care Planning can be found at [facility] [place] Advance Health Care Directives Advance Health Care Directives ([email or web address]    Conditions/risks identified: Aim for 30 minutes of exercise or brisk walking, 6-8 glasses of water, and 5 servings of fruits and vegetables each day.  You have an order for:  []   2D Mammogram  [x]   3D Mammogram  []   Bone Density     Please call for appointment:  [facility]  [address] [place] [address] [phone]  Make sure to wear two-piece clothing.  No lotions powders or deodorants the day of the appointment Make sure to bring picture ID and insurance card.  Bring list of medications you are currently taking including any supplements.   Schedule your [place] screening mammogram through MyChart!   Log into your MyChart account.  Go to 'Visit' (or 'Appointments' if on mobile App) --> Schedule an Appointment  Under 'Select a Reason for Visit' choose the Mammogram Screening option.  Complete the pre-visit questions and select the time and place that best fits your schedule.    Next appointment: VIRTUAL/TELEPHONE APPOINTMENT Follow up in one year for your annual wellness visit February 09, 2024 at 3:00pm telephone visit   Preventive Care 65 Years and Older, Female Preventive care refers to lifestyle choices and visits with your health care provider that can promote health and wellness. What does preventive care include? A yearly physical exam. This is also called an annual well check. Dental exams once or twice a year. Routine eye exams. Ask your health care provider how often you should have your eyes checked. Personal lifestyle choices, including: Daily care of your teeth and gums. Regular physical activity. Eating a healthy diet. Avoiding tobacco and drug use. Limiting alcohol use.  Practicing safe sex. Taking low-dose aspirin every day. Taking vitamin and mineral supplements as recommended by your health care provider. What happens during an annual well check? The services and screenings done by your health care provider during your annual well check will depend on your age, overall health, lifestyle risk factors, and family history of disease. Counseling  Your health care provider may ask you questions about your: Alcohol use. Tobacco use. Drug use. Emotional well-being. Home and relationship well-being. Sexual activity. Eating  habits. History of falls. Memory and ability to understand (cognition). Work and work Astronomer. Reproductive health. Screening  You may have the following tests or measurements: Height, weight, and BMI. Blood pressure. Lipid and cholesterol levels. These may be checked every 5 years, or more frequently if you are over 75 years old. Skin check. Lung cancer screening. You may have this screening every year starting at age 89 if you have a 30-pack-year history of smoking and currently smoke or have quit within the past 15 years. Fecal occult blood test (FOBT) of the stool. You may have this test every year starting at age 3. Flexible sigmoidoscopy or colonoscopy. You may have a sigmoidoscopy every 5 years or a colonoscopy every 10 years starting at age 96. Hepatitis C blood test. Hepatitis B blood test. Sexually transmitted disease (STD) testing. Diabetes screening. This is done by checking your blood sugar (glucose) after you have not eaten for a while (fasting). You may have this done every 1-3 years. Bone density scan. This is done to screen for osteoporosis. You may have this done starting at age 21. Mammogram. This may be done every 1-2 years. Talk to your health care provider about how often you should have regular mammograms. Talk with your health care provider about your test results, treatment options, and if necessary, the need for more tests. Vaccines  Your health care provider may recommend certain vaccines, such as: Influenza vaccine. This is recommended every year. Tetanus, diphtheria, and acellular pertussis (Tdap, Td) vaccine. You may need a Td booster every 10 years. Zoster vaccine. You may need this after age 88. Pneumococcal 13-valent conjugate (PCV13) vaccine. One dose is recommended after age 49. Pneumococcal polysaccharide (PPSV23) vaccine. One dose is recommended after age 57. Talk to your health care provider about which screenings and vaccines you need and how  often you need them. This information is not intended to replace advice given to you by your health care provider. Make sure you discuss any questions you have with your health care provider. Document Released: 08/08/2015 Document Revised: 03/31/2016 Document Reviewed: 05/13/2015 Elsevier Interactive Patient Education  2017 [employer].  Fall Prevention in the Home Falls can cause injuries. They can happen to people of all ages. There are many things you can do to make your home safe and to help prevent falls. What can I do on the outside of my home? Regularly fix the edges of walkways and driveways and fix any cracks. Remove anything that might make you trip as you walk through a door, such as a raised step or threshold. Trim any bushes or trees on the path to your home. Use bright outdoor lighting. Clear any walking paths of anything that might make someone trip, such as rocks or tools. Regularly check to see if handrails are loose or broken. Make sure that both sides of any steps have handrails. Any raised decks and porches should have guardrails on the edges. Have any leaves, snow, or ice cleared regularly. Use sand or  salt on walking paths during winter. Clean up any spills in your garage right away. This includes oil or grease spills. What can I do in the bathroom? Use night lights. Install grab bars by the toilet and in the tub and shower. Do not use towel bars as grab bars. Use non-skid mats or decals in the tub or shower. If you need to sit down in the shower, use a plastic, non-slip stool. Keep the floor dry. Clean up any water that spills on the floor as soon as it happens. Remove soap buildup in the tub or shower regularly. Attach bath mats securely with double-sided non-slip rug tape. Do not have throw rugs and other things on the floor that can make you trip. What can I do in the bedroom? Use night lights. Make sure that you have a light by your bed that is easy to  reach. Do not use any sheets or blankets that are too big for your bed. They should not hang down onto the floor. Have a firm chair that has side arms. You can use this for support while you get dressed. Do not have throw rugs and other things on the floor that can make you trip. What can I do in the kitchen? Clean up any spills right away. Avoid walking on wet floors. Keep items that you use a lot in easy-to-reach places. If you need to reach something above you, use a strong step stool that has a grab bar. Keep electrical cords out of the way. Do not use floor polish or wax that makes floors slippery. If you must use wax, use non-skid floor wax. Do not have throw rugs and other things on the floor that can make you trip. What can I do with my stairs? Do not leave any items on the stairs. Make sure that there are handrails on both sides of the stairs and use them. Fix handrails that are broken or loose. Make sure that handrails are as long as the stairways. Check any carpeting to make sure that it is firmly attached to the stairs. Fix any carpet that is loose or worn. Avoid having throw rugs at the top or bottom of the stairs. If you do have throw rugs, attach them to the floor with carpet tape. Make sure that you have a light switch at the top of the stairs and the bottom of the stairs. If you do not have them, ask someone to add them for you. What else can I do to help prevent falls? Wear shoes that: Do not have high heels. Have rubber bottoms. Are comfortable and fit you well. Are closed at the toe. Do not wear sandals. If you use a stepladder: Make sure that it is fully opened. Do not climb a closed stepladder. Make sure that both sides of the stepladder are locked into place. Ask someone to hold it for you, if possible. Clearly mark and make sure that you can see: Any grab bars or handrails. First and last steps. Where the edge of each step is. Use tools that help you move  around (mobility aids) if they are needed. These include: Canes. Walkers. Scooters. Crutches. Turn on the lights when you go into a dark area. Replace any light bulbs as soon as they burn out. Set up your furniture so you have a clear path. Avoid moving your furniture around. If any of your floors are uneven, fix them. If there are any pets around you, be aware of  where they are. Review your medicines with your doctor. Some medicines can make you feel dizzy. This can increase your chance of falling. Ask your doctor what other things that you can do to help prevent falls. This information is not intended to replace advice given to you by your health care provider. Make sure you discuss any questions you have with your health care provider. Document Released: 05/08/2009 Document Revised: 12/18/2015 Document Reviewed: 08/16/2014 Elsevier Interactive Patient Education  2017 [employer].

## 2023-02-03 NOTE — Progress Notes (Signed)
 Subjective:   Chloe Ali is a 77 y.o. female who presents for Medicare Annual (Subsequent) preventive examination.  Visit Complete: Virtual  I connected with  Chloe Ali on 02/03/23 by a audio enabled telemedicine application and verified that I am speaking with the correct person using two identifiers.  Patient Location: Home  Provider Location: Home Office  I discussed the limitations of evaluation and management by telemedicine. The patient expressed understanding and agreed to proceed.  Patient Medicare AWV questionnaire was completed by the patient on n/a; I have confirmed that all information answered by patient is correct and no changes since this date.  Review of Systems     Cardiac Risk Factors include: advanced age (>63men, >42 women);dyslipidemia     Objective:    Today's Vitals   02/03/23 1507  Weight: 124 lb (56.2 kg)  Height: 5\' 4"  (1.626 m)   Body mass index is 21.28 kg/m.     02/03/2023    3:07 PM 05/10/2021   10:07 AM 09/11/2019    3:30 PM 09/08/2018   12:15 PM 11/07/2017    7:26 AM 09/05/2017   10:20 AM 09/03/2016   11:09 AM  Advanced Directives  Does Patient Have a Medical Advance Directive? No No No No No Yes Yes  Type of Careers adviser;Living will Healthcare Power of [place];Living will  Does patient want to make changes to medical advance directive?    No - Patient declined     Copy of Healthcare Power of Attorney in Chart?      No - copy requested No - copy requested  Would patient like information on creating a medical advance directive? No - Patient declined  No - Patient declined No - Patient declined       Current Medications (verified) Outpatient Encounter Medications as of 02/03/2023  Medication Sig   alendronate (FOSAMAX) 70 MG tablet Take 1 tablet (70 mg total) by mouth every 7 (seven) days. Take with a full glass of water on an empty stomach.   atorvastatin (LIPITOR) 10 MG tablet Take 1  tablet (10 mg total) by mouth daily.   calcium citrate (CALCITRATE - DOSED IN MG ELEMENTAL CALCIUM) 950 (200 Ca) MG tablet Take 1 tablet by mouth daily.   cholecalciferol (VITAMIN D) 1000 UNITS tablet Take 1,000 Units by mouth daily.   Coenzyme Q10 (COQ10 PO) Take 1 tablet by mouth daily.   Multiple Vitamins-Minerals (OCUVITE PO) Take 1 tablet by mouth daily.   fluticasone (FLONASE) 50 MCG/ACT nasal spray Place 2 sprays into both nostrils daily. (Patient not taking: Reported on 02/03/2023)   meloxicam (MOBIC) 15 MG tablet Take by mouth.   mometasone (ELOCON) 0.1 % ointment Apply 1-2 times per day to affected areas until clear. (Patient not taking: Reported on 11/12/2022)   Probiotic Product (PROBIOTIC PO) Take 1 capsule by mouth daily.   No facility-administered encounter medications on file as of 02/03/2023.    Allergies (verified) Patient has no known allergies.   History: Past Medical History:  Diagnosis Date   Actinic keratosis    Hay fever    History of chicken pox    HLD (hyperlipidemia)    Rosacea    Past Surgical History:  Procedure Laterality Date   ABDOMINAL HYSTERECTOMY     ANTERIOR AND POSTERIOR REPAIR N/A 07/22/2015   Procedure: ANTERIOR (CYSTOCELE) AND POSTERIOR REPAIR (RECTOCELE);  Surgeon: Alfredo Martinez, MD;  Location: [facility];  Service: Urology;  Laterality: N/A;   CATARACT EXTRACTION W/ INTRAOCULAR LENS IMPLANT Left 06/2017   CATARACT EXTRACTION W/ INTRAOCULAR LENS IMPLANT Right 07/2017   COLONOSCOPY  09/20/2007   CYSTO N/A 07/22/2015   Procedure: Clearnce Sorrel;  Surgeon: Alfredo Martinez, MD;  Location: [facility];  Service: Urology;  Laterality: N/A;   TUBAL LIGATION  1976   VAGINAL HYSTERECTOMY N/A 07/22/2015   Procedure: HYSTERECTOMY VAGINAL;  Surgeon: Reva Bores, MD;  Location: [facility];  Service: Gynecology;  Laterality: N/A;  Requested 07/22/15 @ 7:30a with Dr. Caryl Pina to follow   Family History  Problem Relation Age of Onset   Pancreatic cancer Mother     Diabetes Mother    Cancer Mother        pancreatic   Lung cancer Father    Cancer Father        lung   Stroke Maternal Grandfather    Breast cancer Sister    Cancer Sister 69       breast   Colon cancer Neg Hx    Social History   Socioeconomic History   Marital status: Married    Spouse name: Not on file   Number of children: 4   Years of education: Not on file   Highest education level: Not on file  Occupational History   Occupation: Retired  Tobacco Use   Smoking status: Never   Smokeless tobacco: Never  Vaping Use   Vaping status: Never Used  Substance and Sexual Activity   Alcohol use: Yes    Alcohol/week: 8.0 standard drinks of alcohol    Types: 7 Glasses of wine, 1 Shots of liquor per week   Drug use: No   Sexual activity: Not Currently    Birth control/protection: Post-menopausal, Surgical  Other Topics Concern   Not on file  Social History Narrative   Married      4 children      Husband on disability after car accident      Retired      No regular exercise            Social Determinants of Health   Financial Resource Strain: Low Risk  (02/03/2023)   Overall Financial Resource Strain (CARDIA)    Difficulty of Paying Living Expenses: Not hard at all  Food Insecurity: No Food Insecurity (09/11/2019)   Hunger Vital Sign    Worried About Running Out of Food in the Last Year: Never true    Ran Out of Food in the Last Year: Never true  Transportation Needs: No Transportation Needs (02/03/2023)   PRAPARE - Administrator, Civil Service (Medical): No    Lack of Transportation (Non-Medical): No  Physical Activity: Insufficiently Active (02/03/2023)   Exercise Vital Sign    Days of Exercise per Week: 4 days    Minutes of Exercise per Session: 30 min  Stress: No Stress Concern Present (02/03/2023)   [employer] of Occupational Health - Occupational Stress Questionnaire    Feeling of Stress : Not at all  Social Connections: Moderately  Isolated (02/03/2023)   Social Connection and Isolation Panel [NHANES]    Frequency of Communication with Friends and Family: More than three times a week    Frequency of Social Gatherings with Friends and Family: More than three times a week    Attends Religious Services: Never    Database administrator or Organizations: No    Attends Banker Meetings: Never    Marital Status: Married  Tobacco Counseling Counseling given: Yes   Clinical Intake:  Pre-visit preparation completed: Yes  Pain : No/denies pain     BMI - recorded: 21.28 Nutritional Status: BMI of 19-24  Normal Nutritional Risks: None Diabetes: No  How often do you need to have someone help you when you read instructions, pamphlets, or other written materials from your doctor or pharmacy?: 1 - Never  Interpreter Needed?: No  Information entered by ::  Kamir Selover, CMA   Activities of Daily Living    02/03/2023    3:16 PM 11/12/2022    9:55 AM  In your present state of health, do you have any difficulty performing the following activities:  Hearing? 0 0  Vision? 0 0  Difficulty concentrating or making decisions? 0 0  Walking or climbing stairs? 0 0  Dressing or bathing? 0 0  Doing errands, shopping? 0 0  Preparing Food and eating ? N   Using the Toilet? N   In the past six months, have you accidently leaked urine? N   Do you have problems with loss of bowel control? N   Managing your Medications? N   Managing your Finances? N   Housekeeping or managing your Housekeeping? N     Patient Care Team: Tower, Audrie Gallus, MD as PCP - Keturah Barre, MD as Consulting Physician (Ophthalmology)  Indicate any recent Medical Services you may have received from other than [facility] providers in the past year (date may be approximate).     Assessment:   This is a routine wellness examination for [place].  Hearing/Vision screen Hearing Screening - Comments:: Patient denies any hearing  difficulties.    Dietary issues and exercise activities discussed:     Goals Addressed             This Visit's Progress    Patient Stated       To keep working and stay healthy       Depression Screen    02/03/2023    3:15 PM 11/12/2022    9:55 AM 11/10/2021    9:57 AM 09/19/2020   10:02 AM 09/11/2019    3:32 PM 09/08/2018    3:17 PM 08/23/2018    9:45 AM  PHQ 2/9 Scores  PHQ - 2 Score 0 0 0 0 0 0 0  PHQ- 9 Score  0   0 0 1    Fall Risk    02/03/2023    3:15 PM 11/12/2022    9:55 AM 11/10/2021    9:56 AM 09/19/2020   10:02 AM 09/11/2019    3:31 PM  Fall Risk   Falls in the past year? 0 0 1 0 0  Number falls in past yr: 0 0 0  0  Injury with Fall? 0 0 1  0  Risk for fall due to : No Fall Risks No Fall Risks   No Fall Risks  Follow up Falls prevention discussed  Falls evaluation completed  Falls prevention discussed;Falls evaluation completed    MEDICARE RISK AT HOME:  Medicare Risk at Home - 02/03/23 1510     Any stairs in or around the home? No    If so, are there any without handrails? No    Home free of loose throw rugs in walkways, pet beds, electrical cords, etc? Yes    Adequate lighting in your home to reduce risk of falls? Yes    Life alert? No    Use of a cane, walker or w/c? No  Grab bars in the bathroom? Yes    Shower chair or bench in shower? Yes    Elevated toilet seat or a handicapped toilet? Yes             TIMED UP AND GO:  Was the test performed?  No    Cognitive Function:    09/11/2019    3:34 PM 09/08/2018    3:18 PM 09/05/2017   10:19 AM 09/05/2017   10:15 AM 09/03/2016   10:48 AM  MMSE - Mini Mental State Exam  Orientation to time 5 5 5 5 5   Orientation to Place 5 5 5 5 5   Registration 3 3 3 3 3   Attention/ Calculation 5 0 0 0 0  Recall 3 3 2 3 3   Recall-comments   unable to recall 1 of 3 words    Language- name 2 objects  0 0 0 0  Language- repeat 1 1 1 1 1   Language- follow 3 step command  3 3 3 3   Language- read & follow  direction  0 0 0 0  Write a sentence  0 0 0 0  Copy design  0 0 0 0  Total score  20 19 20 20         02/03/2023    3:11 PM  6CIT Screen  What Year? 0 points  What month? 0 points  What time? 0 points  Count back from 20 0 points  Months in reverse 0 points  Repeat phrase 2 points  Total Score 2 points    Immunizations Immunization History  Administered Date(s) Administered   Fluad Quad(high Dose 65+) 04/26/2019   Influenza Split 07/29/2011, 06/15/2012   Influenza, High Dose Seasonal PF 05/02/2017, 05/26/2020   Influenza,inj,Quad PF,6+ Mos 06/24/2014, 09/03/2015, 06/10/2016   Influenza-Unspecified 05/09/2018   Pneumococcal Conjugate-13 06/24/2014   Pneumococcal Polysaccharide-23 12/28/2011   Td 12/28/2011   Zoster Recombinant(Shingrix) 12/25/2017, 03/09/2018    TDAP status: Due, Education has been provided regarding the importance of this vaccine. Advised may receive this vaccine at local pharmacy or Health Dept. Aware to provide a copy of the vaccination record if obtained from local pharmacy or Health Dept. Verbalized acceptance and understanding.  Flu Vaccine status: Up to date  Pneumococcal vaccine status: Up to date  Covid-19 vaccine status: Completed vaccines  Qualifies for Shingles Vaccine? Yes   Zostavax completed No   Shingrix Completed?: Yes  Screening Tests Health Maintenance  Topic Date Due   COVID-19 Vaccine (1) Never done   DTaP/Tdap/Td (2 - Tdap) 12/27/2021   Medicare Annual Wellness (AWV)  11/11/2022   INFLUENZA VACCINE  02/24/2023   MAMMOGRAM  03/05/2023   Pneumonia Vaccine 2+ Years old  Completed   DEXA SCAN  Completed   Hepatitis C Screening  Completed   Zoster Vaccines- Shingrix  Completed   HPV VACCINES  Aged Out    Health Maintenance  Health Maintenance Due  Topic Date Due   COVID-19 Vaccine (1) Never done   DTaP/Tdap/Td (2 - Tdap) 12/27/2021   Medicare Annual Wellness (AWV)  11/11/2022    Colorectal Screening: patient  decilned  Mammogram status: Ordered 02/03/2023. Pt provided with contact info and advised to call to schedule appt.   Bone Density status: Completed 03/04/2022. Results reflect: Bone density results: OSTEOPENIA. Repeat every 2-5 years.  Lung Cancer Screening: (Low Dose CT Chest recommended if Age 32-80 years, 20 pack-year currently smoking OR have quit w/in 15years.) does not qualify.   Additional Screening:  Hepatitis C  Screening: does not qualify; Completed 09/03/2016  Vision Screening: Recommended annual ophthalmology exams for early detection of glaucoma and other disorders of the eye. Is the patient up to date with their annual eye exam?  Yes  Who is the provider or what is the name of the office in which the patient attends annual eye exams? Eye Doctor: Dr. Dione Booze, Retinal Specialist is Dr. Luciana Axe If pt is not established with a provider, would they like to be referred to a provider to establish care? No .   Dental Screening: Recommended annual dental exams for proper oral hygiene  Diabetic Foot Exam: n/a  Community Resource Referral / Chronic Care Management: CRR required this visit?  No   CCM required this visit?  No     Plan:     I have personally reviewed and noted the following in the patient's chart:   Medical and social history Use of alcohol, tobacco or illicit drugs  Current medications and supplements including opioid prescriptions. Patient is not currently taking opioid prescriptions. Functional ability and status Nutritional status Physical activity Advanced directives List of other physicians Hospitalizations, surgeries, and ER visits in previous 12 months Vitals Screenings to include cognitive, depression, and falls Referrals and appointments  In addition, I have reviewed and discussed with patient certain preventive protocols, quality metrics, and best practice recommendations. A written personalized care plan for preventive services as well as general  preventive health recommendations were provided to patient.   Any medications not marked as taking were not mentioned by the patient (or their caregiver if applicable) when reconciling the medications.   Because this visit was a virtual/telehealth visit,  certain criteria was not obtained, such a blood pressure, CBG if patient is a diabetic, and timed up and go.    Jordan Hawks Kyan Yurkovich, CMA   02/03/2023   After Visit Summary: (Mail) Due to this being a telephonic visit, the after visit summary with patients personalized plan was offered to patient via mail   Nurse Notes:

## 2023-02-10 ENCOUNTER — Other Ambulatory Visit: Payer: Self-pay | Admitting: *Deleted

## 2023-02-10 MED ORDER — ALENDRONATE SODIUM 70 MG PO TABS: 70.0000 mg | ORAL_TABLET | ORAL | 2 refills | Status: DC

## 2023-03-18 ENCOUNTER — Ambulatory Visit
Admission: RE | Admit: 2023-03-18 | Discharge: 2023-03-18 | Disposition: A | Discharge status: Home / Self Care | Payer: Medicare Other | Source: Ambulatory Visit | Attending: Family Medicine | Admitting: Family Medicine

## 2023-03-18 DIAGNOSIS — Z1231 Encounter for screening mammogram for malignant neoplasm of breast: Secondary | ICD-10-CM | POA: Insufficient documentation

## 2023-04-14 DIAGNOSIS — L570 Actinic keratosis: Secondary | ICD-10-CM | POA: Diagnosis not present

## 2023-04-21 ENCOUNTER — Encounter (INDEPENDENT_AMBULATORY_CARE_PROVIDER_SITE_OTHER): Payer: Medicare Other | Admitting: Ophthalmology

## 2023-05-10 DIAGNOSIS — L244 Irritant contact dermatitis due to drugs in contact with skin: Secondary | ICD-10-CM | POA: Diagnosis not present

## 2023-06-09 DIAGNOSIS — L570 Actinic keratosis: Secondary | ICD-10-CM | POA: Diagnosis not present

## 2023-06-21 DIAGNOSIS — H26493 Other secondary cataract, bilateral: Secondary | ICD-10-CM | POA: Diagnosis not present

## 2023-06-21 DIAGNOSIS — H35371 Puckering of macula, right eye: Secondary | ICD-10-CM | POA: Diagnosis not present

## 2023-06-21 DIAGNOSIS — H04123 Dry eye syndrome of bilateral lacrimal glands: Secondary | ICD-10-CM | POA: Diagnosis not present

## 2023-06-21 DIAGNOSIS — H43811 Vitreous degeneration, right eye: Secondary | ICD-10-CM | POA: Diagnosis not present

## 2023-10-21 DIAGNOSIS — M545 Low back pain, unspecified: Secondary | ICD-10-CM | POA: Diagnosis not present

## 2023-10-24 ENCOUNTER — Other Ambulatory Visit: Payer: Self-pay

## 2023-10-24 MED ORDER — ALENDRONATE SODIUM 70 MG PO TABS: 70.0000 mg | ORAL_TABLET | ORAL | 2 refills | Status: AC

## 2023-10-24 NOTE — Telephone Encounter (Signed)
 Medication: alendronate (FOSAMAX) 70 MG tablet  Directions: 1 tab every 7 days  Last given: 02/10/24 Number refills: 2 Dexa: 03/04/24 due every 2 yrs

## 2023-12-22 ENCOUNTER — Other Ambulatory Visit: Payer: Self-pay | Admitting: Family Medicine

## 2023-12-23 NOTE — Telephone Encounter (Signed)
 Pt is over due for her CPE (labs prior), please schedule and then route back to me

## 2023-12-23 NOTE — Telephone Encounter (Signed)
 lvm for pt to call office to schedule appt for cpe / labs

## 2023-12-23 NOTE — Telephone Encounter (Signed)
 m for pt to call office to schedule appt

## 2023-12-26 NOTE — Telephone Encounter (Signed)
 Pt scheduled cpe with e2c2 for 02/08/24

## 2024-01-22 ENCOUNTER — Telehealth: Payer: Self-pay | Admitting: Family Medicine

## 2024-01-22 DIAGNOSIS — Z131 Encounter for screening for diabetes mellitus: Secondary | ICD-10-CM

## 2024-01-22 DIAGNOSIS — E7800 Pure hypercholesterolemia, unspecified: Secondary | ICD-10-CM

## 2024-01-22 NOTE — Telephone Encounter (Signed)
-----   Message from Chloe Ali sent at 01/11/2024  2:26 PM EDT ----- Regarding: Labs. Mon 01/30/24 Hello,  Patient is coming in for CPE labs on Monday 01/30/24. Can we get orders please.   Thanks

## 2024-01-30 ENCOUNTER — Ambulatory Visit: Payer: Self-pay | Admitting: Family Medicine

## 2024-01-30 ENCOUNTER — Other Ambulatory Visit (INDEPENDENT_AMBULATORY_CARE_PROVIDER_SITE_OTHER)

## 2024-01-30 DIAGNOSIS — E7800 Pure hypercholesterolemia, unspecified: Secondary | ICD-10-CM

## 2024-01-30 LAB — LIPID PANEL
Cholesterol: 182 mg/dL (ref 0–200)
HDL: 76 mg/dL (ref >39.00)
LDL Cholesterol: 81 mg/dL (ref 0–99)
NonHDL: 105.89
Total CHOL/HDL Ratio: 2
Triglycerides: 126 mg/dL (ref 0.0–149.0)
VLDL: 25.2 mg/dL (ref 0.0–40.0)

## 2024-01-30 LAB — TSH: TSH: 1.87 u[IU]/mL (ref 0.35–5.50)

## 2024-01-30 LAB — COMPREHENSIVE METABOLIC PANEL WITH GFR
ALT: 11 U/L (ref 0–35)
AST: 15 U/L (ref 0–37)
Albumin: 4.5 g/dL (ref 3.5–5.2)
Alkaline Phosphatase: 59 U/L (ref 39–117)
BUN: 11 mg/dL (ref 6–23)
CO2: 29 meq/L (ref 19–32)
Calcium: 9.6 mg/dL (ref 8.4–10.5)
Chloride: 101 meq/L (ref 96–112)
Creatinine, Ser: 0.79 mg/dL (ref 0.40–1.20)
GFR: 72.07 mL/min (ref >60.00)
Glucose, Bld: 96 mg/dL (ref 70–99)
Potassium: 4.1 meq/L (ref 3.5–5.1)
Sodium: 137 meq/L (ref 135–145)
Total Bilirubin: 0.8 mg/dL (ref 0.2–1.2)
Total Protein: 6.5 g/dL (ref 6.0–8.3)

## 2024-02-08 ENCOUNTER — Encounter: Payer: Self-pay | Admitting: Family Medicine

## 2024-02-08 ENCOUNTER — Ambulatory Visit (INDEPENDENT_AMBULATORY_CARE_PROVIDER_SITE_OTHER): Payer: Medicare (Managed Care) | Admitting: Family Medicine

## 2024-02-08 VITALS — BP 122/68 | HR 91 | Temp 97.9°F | Ht 63.0 in | Wt 119.4 lb

## 2024-02-08 DIAGNOSIS — Z0000 Encounter for general adult medical examination without abnormal findings: Secondary | ICD-10-CM | POA: Diagnosis not present

## 2024-02-08 DIAGNOSIS — M8589 Other specified disorders of bone density and structure, multiple sites: Secondary | ICD-10-CM

## 2024-02-08 DIAGNOSIS — Z1211 Encounter for screening for malignant neoplasm of colon: Secondary | ICD-10-CM

## 2024-02-08 DIAGNOSIS — Z1231 Encounter for screening mammogram for malignant neoplasm of breast: Secondary | ICD-10-CM

## 2024-02-08 DIAGNOSIS — E7800 Pure hypercholesterolemia, unspecified: Secondary | ICD-10-CM

## 2024-02-08 DIAGNOSIS — E2839 Other primary ovarian failure: Secondary | ICD-10-CM

## 2024-02-08 NOTE — Assessment & Plan Note (Signed)
 Colonoscopy is utd from 2019

## 2024-02-08 NOTE — Assessment & Plan Note (Signed)
Mammogram ordered °Pt will call to schedule  °

## 2024-02-08 NOTE — Assessment & Plan Note (Addendum)
 Reviewed health habits including diet and exercise and skin cancer prevention Reviewed appropriate screening tests for age  Also reviewed health mt list, fam hx and immunization status , as well as social and family history   See HPI Labs reviewed and ordered Health Maintenance  Topic Date Due   COVID-19 Vaccine (1 - 2024-25 season) 02/07/2026*   Flu Shot  02/24/2024   Mammogram  03/17/2024   Medicare Annual Wellness Visit  02/07/2025   DTaP/Tdap/Td vaccine (3 - Td or Tdap) 01/18/2033   Pneumococcal Vaccine for age over 74  Completed   DEXA scan (bone density measurement)  Completed   Hepatitis C Screening  Completed   Zoster (Shingles) Vaccine  Completed   Hepatitis B Vaccine  Aged Out   HPV Vaccine  Aged Out   Meningitis B Vaccine  Aged Out  *Topic was postponed. The date shown is not the original due date.    Mammogram and dexa ordered for August  Discussed fall prevention, supplements and exercise for bone density  PHQ 3 with caregiver stress-declines counseling/ treatment at this time Encouraged more protein calories to prevent muscle loss

## 2024-02-08 NOTE — Progress Notes (Signed)
 Subjective:    Patient ID: Chloe Ali, female    DOB: 1946-05-26, 78 y.o.   MRN: [id number]  HPI  Here for health maintenance exam and to review chronic medical problems   Wt Readings from Last 3 Encounters:  02/08/24 119 lb 6 oz (54.1 kg)  02/03/23 124 lb (56.2 kg)  11/12/22 125 lb (56.7 kg)   21.15 kg/m  Vitals:   02/08/24 0857  BP: 122/68  Pulse: 91  Temp: 97.9 F (36.6 C)  SpO2: 99%    Immunization History  Administered Date(s) Administered   Fluad Quad(high Dose 65+) 04/26/2019   Influenza Split 07/29/2011, 06/15/2012   Influenza, High Dose Seasonal PF 05/02/2017, 05/26/2020   Influenza,inj,Quad PF,6+ Mos 06/24/2014, 09/03/2015, 06/10/2016   Influenza-Unspecified 05/09/2018   Pneumococcal Conjugate-13 06/24/2014   Pneumococcal Polysaccharide-23 12/28/2011   Td 12/28/2011   Tdap 01/19/2023   Zoster Recombinant(Shingrix) 12/25/2017, 03/09/2018    There are no preventive care reminders to display for this patient.  Feeling ok  Tired due to very busy schedule/ plate is very full -prefers that  Likes to work in flower bed  Is OCD about housework   Husband is handicapped - had a hip replacement  Caregiver    Mammogram Last was 8/24  Self breast exam-no lumps   Gyn health No problems    Colon cancer screening -colonoscopy 2019   Bone health  Dexa 02/2022 mixed trend osteopenia  Taking alendronate  since 2021  Falls-none  Fractures-none  Supplements - ca and D  Last vitamin D  Lab Results  Component Value Date   VD25OH 36.23 09/08/2018    Exercise  Not a lot of time for exercise  Goes to a class once a week for an hour - senior exercise program     Mood    02/08/2024    9:03 AM 02/03/2023    3:15 PM 11/12/2022    9:55 AM 11/10/2021    9:57 AM 09/19/2020   10:02 AM  Depression screen PHQ 2/9  Decreased Interest 0 0 0 0 0  Down, Depressed, Hopeless 0 0 0 0 0  PHQ - 2 Score 0 0 0 0 0  Altered sleeping 0  0    Tired, decreased energy  3  0    Change in appetite 0  0    Feeling bad or failure about yourself  0  0    Trouble concentrating 0  0    Moving slowly or fidgety/restless 0  0    Suicidal thoughts 0  0    PHQ-9 Score 3  0    Difficult doing work/chores Not difficult at all  Not difficult at all     Declines counseling or treatment  Prayer is her coping mechanism    Hyperlipidemia Lab Results  Component Value Date   CHOL 182 01/30/2024   CHOL 166 11/05/2022   CHOL 200 11/06/2021   Lab Results  Component Value Date   HDL 76.00 01/30/2024   HDL 62.30 11/05/2022   HDL 64.60 11/06/2021   Lab Results  Component Value Date   LDLCALC 81 01/30/2024   LDLCALC 74 11/05/2022   LDLCALC 111 (H) 11/06/2021   Lab Results  Component Value Date   TRIG 126.0 01/30/2024   TRIG 152.0 (H) 11/05/2022   TRIG 123.0 11/06/2021   Lab Results  Component Value Date   CHOLHDL 2 01/30/2024   CHOLHDL 3 11/05/2022   CHOLHDL 3 11/06/2021   Lab Results  Component Value Date  LDLDIRECT 143.0 08/27/2015   LDLDIRECT 149.7 01/02/2013   LDLDIRECT 140.7 12/21/2011   Atorvastatin  10 mg daily  Co Q 10    Lab Results  Component Value Date   NA 137 01/30/2024   K 4.1 01/30/2024   CO2 29 01/30/2024   GLUCOSE 96 01/30/2024   BUN 11 01/30/2024   CREATININE 0.79 01/30/2024   CALCIUM  9.6 01/30/2024   GFR 72.07 01/30/2024   GFRNONAA >60 07/08/2015   Lab Results  Component Value Date   ALT 11 01/30/2024   AST 15 01/30/2024   ALKPHOS 59 01/30/2024   BILITOT 0.8 01/30/2024    Lab Results  Component Value Date   TSH 1.87 01/30/2024      Patient Active Problem List   Diagnosis Date Noted   Diabetes mellitus screening 11/02/2022   Encounter for screening mammogram for breast cancer 11/10/2021   Early stage nonexudative age-related macular degeneration of both eyes 04/16/2020   Left epiretinal membrane 04/16/2020   Pseudophakia, both eyes 04/16/2020   History of vitrectomy 04/16/2020   Colon cancer screening  09/09/2017   History of shingles 07/25/2017   Routine general medical examination at a health care facility 08/26/2015   Osteopenia 08/16/2014   Estrogen deficiency 06/24/2014   Irritable bowel syndrome 09/04/2012   Eczema 12/28/2011   ALLERGIC RHINITIS CAUSE UNSPECIFIED 03/26/2010   HYPERCHOLESTEROLEMIA 08/23/2007   Past Medical History:  Diagnosis Date   Actinic keratosis    Hay fever    History of chicken pox    HLD (hyperlipidemia)    Rosacea    Past Surgical History:  Procedure Laterality Date   ABDOMINAL HYSTERECTOMY     ANTERIOR AND POSTERIOR REPAIR N/A 07/22/2015   Procedure: ANTERIOR (CYSTOCELE) AND POSTERIOR REPAIR (RECTOCELE);  Surgeon: Glendia Elizabeth, MD;  Location: [facility];  Service: Urology;  Laterality: N/A;   CATARACT EXTRACTION W/ INTRAOCULAR LENS IMPLANT Left 06/2017   CATARACT EXTRACTION W/ INTRAOCULAR LENS IMPLANT Right 07/2017   COLONOSCOPY  09/20/2007   CYSTO N/A 07/22/2015   Procedure: JONATHON;  Surgeon: Glendia Elizabeth, MD;  Location: [facility];  Service: Urology;  Laterality: N/A;   TUBAL LIGATION  1976   VAGINAL HYSTERECTOMY N/A 07/22/2015   Procedure: HYSTERECTOMY VAGINAL;  Surgeon: Glenys GORMAN Birk, MD;  Location: [facility];  Service: Gynecology;  Laterality: N/A;  Requested 07/22/15 @ 7:30a with Dr. Loa to follow   Social History   Tobacco Use   Smoking status: Never   Smokeless tobacco: Never  Vaping Use   Vaping status: Never Used  Substance Use Topics   Alcohol use: Yes    Alcohol/week: 8.0 standard drinks of alcohol    Types: 7 Glasses of wine, 1 Shots of liquor per week   Drug use: No   Family History  Problem Relation Age of Onset   Pancreatic cancer Mother    Diabetes Mother    Cancer Mother        pancreatic   Lung cancer Father    Cancer Father        lung   Stroke Maternal Grandfather    Breast cancer Sister    Cancer Sister 91       breast   Colon cancer Neg Hx    No Known Allergies Current Outpatient Medications on  File Prior to Visit  Medication Sig Dispense Refill   alendronate  (FOSAMAX ) 70 MG tablet Take 1 tablet (70 mg total) by mouth every 7 (seven) days. Take with a full glass of water  on an  empty stomach. 12 tablet 2   atorvastatin  (LIPITOR) 10 MG tablet Take 1 tablet by mouth once daily 90 tablet 0   calcium  citrate (CALCITRATE - DOSED IN MG ELEMENTAL CALCIUM ) 950 (200 Ca) MG tablet Take 1 tablet by mouth daily.     cholecalciferol (VITAMIN D ) 1000 UNITS tablet Take 1,000 Units by mouth daily.     Coenzyme Q10 (COQ10 PO) Take 1 tablet by mouth daily.     fluticasone  (FLONASE ) 50 MCG/ACT nasal spray Place 2 sprays into both nostrils daily. 16 g 6   meloxicam (MOBIC) 15 MG tablet Take by mouth.     mometasone  (ELOCON ) 0.1 % ointment Apply 1-2 times per day to affected areas until clear. 15 g 1   Multiple Vitamins-Minerals (OCUVITE PO) Take 1 tablet by mouth daily.     Probiotic Product (PROBIOTIC PO) Take 1 capsule by mouth daily.     No current facility-administered medications on file prior to visit.    Review of Systems  Constitutional:  Negative for activity change, appetite change, fatigue, fever and unexpected weight change.  HENT:  Negative for congestion, ear pain, rhinorrhea, sinus pressure and sore throat.   Eyes:  Negative for pain, redness and visual disturbance.  Respiratory:  Negative for cough, shortness of breath and wheezing.   Cardiovascular:  Negative for chest pain and palpitations.  Gastrointestinal:  Negative for abdominal pain, blood in stool, constipation and diarrhea.  Endocrine: Negative for polydipsia and polyuria.  Genitourinary:  Negative for dysuria, frequency and urgency.  Musculoskeletal:  Negative for arthralgias, back pain and myalgias.  Skin:  Negative for pallor and rash.  Allergic/Immunologic: Negative for environmental allergies.  Neurological:  Negative for dizziness, syncope and headaches.  Hematological:  Negative for adenopathy. Does not bruise/bleed  easily.  Psychiatric/Behavioral:  Negative for decreased concentration and dysphoric mood. The patient is not nervous/anxious.        Some care giving stress        Objective:   Physical Exam Constitutional:      General: She is not in acute distress.    Appearance: Normal appearance. She is well-developed and normal weight. She is not ill-appearing or diaphoretic.  HENT:     Head: Normocephalic and atraumatic.     Right Ear: Tympanic membrane, ear canal and external ear normal.     Left Ear: Tympanic membrane, ear canal and external ear normal.     Nose: Nose normal. No congestion.     Mouth/Throat:     Mouth: Mucous membranes are moist.     Pharynx: Oropharynx is clear. No posterior oropharyngeal erythema.  Eyes:     General: No scleral icterus.    Extraocular Movements: Extraocular movements intact.     Conjunctiva/sclera: Conjunctivae normal.     Pupils: Pupils are equal, round, and reactive to light.  Neck:     Thyroid : No thyromegaly.     Vascular: No carotid bruit or JVD.  Cardiovascular:     Rate and Rhythm: Normal rate and regular rhythm.     Pulses: Normal pulses.     Heart sounds: Normal heart sounds.     No gallop.  Pulmonary:     Effort: Pulmonary effort is normal. No respiratory distress.     Breath sounds: Normal breath sounds. No wheezing.     Comments: Good air exch Chest:     Chest wall: No tenderness.  Abdominal:     General: Bowel sounds are normal. There is no distension or abdominal bruit.  Palpations: Abdomen is soft. There is no mass.     Tenderness: There is no abdominal tenderness.     Hernia: No hernia is present.  Genitourinary:    Comments: Breast exam: No mass, nodules, thickening, tenderness, bulging, retraction, inflamation, nipple discharge or skin changes noted.  No axillary or clavicular LA.     Musculoskeletal:        General: No tenderness. Normal range of motion.     Cervical back: Normal range of motion and neck supple. No  rigidity. No muscular tenderness.     Right lower leg: No edema.     Left lower leg: No edema.     Comments: No kyphosis   Lymphadenopathy:     Cervical: No cervical adenopathy.  Skin:    General: Skin is warm and dry.     Coloration: Skin is not pale.     Findings: No erythema or rash.     Comments: Solar lentigines diffusely   Neurological:     Mental Status: She is alert. Mental status is at baseline.     Cranial Nerves: No cranial nerve deficit.     Motor: No abnormal muscle tone.     Coordination: Coordination normal.     Gait: Gait normal.     Deep Tendon Reflexes: Reflexes are normal and symmetric. Reflexes normal.  Psychiatric:        Mood and Affect: Mood normal.        Cognition and Memory: Cognition and memory normal.           Assessment & Plan:   Problem List Items Addressed This Visit       Musculoskeletal and Integument   Osteopenia   Dexa ordered for August  On alendronate  since 2021 -will take for 5 y course  Discussed fall prevention, supplements and exercise for bone density  No falls or fracture         Other   Routine general medical examination at a health care facility - Primary   Reviewed health habits including diet and exercise and skin cancer prevention Reviewed appropriate screening tests for age  Also reviewed health mt list, fam hx and immunization status , as well as social and family history   See HPI Labs reviewed and ordered Health Maintenance  Topic Date Due   COVID-19 Vaccine (1 - 2024-25 season) 02/07/2026*   Flu Shot  02/24/2024   Mammogram  03/17/2024   Medicare Annual Wellness Visit  02/07/2025   DTaP/Tdap/Td vaccine (3 - Td or Tdap) 01/18/2033   Pneumococcal Vaccine for age over 87  Completed   DEXA scan (bone density measurement)  Completed   Hepatitis C Screening  Completed   Zoster (Shingles) Vaccine  Completed   Hepatitis B Vaccine  Aged Out   HPV Vaccine  Aged Out   Meningitis B Vaccine  Aged Out  *Topic was  postponed. The date shown is not the original due date.    Mammogram and dexa ordered for August  Discussed fall prevention, supplements and exercise for bone density  PHQ 3 with caregiver stress-declines counseling/ treatment at this time Encouraged more protein calories to prevent muscle loss       HYPERCHOLESTEROLEMIA   Disc goals for lipids and reasons to control them Rev last labs with pt Rev low sat fat diet in detail LDL 81 Continues atorvastatin  10 mg daily with co Q-10        Estrogen deficiency   Dexa ordered  Relevant Orders   DG Bone Density   Encounter for screening mammogram for breast cancer   Mammogram ordered Pt will call to schedule       Relevant Orders   MM 3D SCREENING MAMMOGRAM BILATERAL BREAST   Colon cancer screening   Colonoscopy is utd from 2019

## 2024-02-08 NOTE — Assessment & Plan Note (Signed)
 Dexa ordered

## 2024-02-08 NOTE — Assessment & Plan Note (Signed)
 Disc goals for lipids and reasons to control them Rev last labs with pt Rev low sat fat diet in detail LDL 81 Continues atorvastatin  10 mg daily with co Q-10

## 2024-02-08 NOTE — Assessment & Plan Note (Signed)
 Dexa ordered for August  On alendronate  since 2021 -will take for 5 y course  Discussed fall prevention, supplements and exercise for bone density  No falls or fracture

## 2024-02-08 NOTE — Patient Instructions (Addendum)
 Start eating more frequently  Make sure you get protein  The following are examples of protein in diet  Meat  Fish  Eggs  Dairy products  Soy products  Oat milk  Almond milk Nuts and nut butters  Legumes  Dried beans    Premier protein drinks are helpful if you don't have time to eat   Try to fit in exercise when you can to keep the muscle you have Add some strength training to your routine, this is important for bone and brain health and can reduce your risk of falls and help your body use insulin properly and regulate weight  Light weights, exercise bands , and internet videos are a good way to start  Yoga (chair or regular), machines , floor exercises or a gym with machines are also good options      You have an order for:  [x]   3D Mammogram  [x]   Bone Density     Please call for appointment:   [x]   [facility]  [address] [place] [address]  [phone]  []   [facility]   [address]  [place], [place] [address]  [phone]  [x]   [facility]      [address] [place], [place]        [phone]         []   [facility]  [address] [place], [place]  [phone]  []  [facility]   [address]   [place], [place] [address]  [phone]  []  [facility]  [address] [place], [place] [address] [phone]    Make sure to wear two piece clothing  No lotions powders or deodorants the day of the appointment Make sure to bring picture ID and insurance card.  Bring list of medications you are currently taking including any supplements.   Schedule your screening mammogram through MyChart!   Select [place] imaging sites can now be scheduled through MyChart.  Log into your MyChart  account.  Go to 'Visit' (or 'Appointments' if  on mobile App) --> Schedule an  Appointment  Under 'Select a Reason for Visit' choose the Mammogram  Screening option.  Complete the pre-visit questions  and select the time and place that  best fits your schedule

## 2024-03-29 DIAGNOSIS — H35371 Puckering of macula, right eye: Secondary | ICD-10-CM | POA: Diagnosis not present

## 2024-03-29 DIAGNOSIS — H353131 Nonexudative age-related macular degeneration, bilateral, early dry stage: Secondary | ICD-10-CM | POA: Diagnosis not present

## 2024-03-29 DIAGNOSIS — H35372 Puckering of macula, left eye: Secondary | ICD-10-CM | POA: Diagnosis not present

## 2024-03-29 DIAGNOSIS — H26493 Other secondary cataract, bilateral: Secondary | ICD-10-CM | POA: Diagnosis not present

## 2024-03-29 DIAGNOSIS — H43811 Vitreous degeneration, right eye: Secondary | ICD-10-CM | POA: Diagnosis not present

## 2024-03-29 DIAGNOSIS — H35032 Hypertensive retinopathy, left eye: Secondary | ICD-10-CM | POA: Diagnosis not present

## 2024-03-29 DIAGNOSIS — H35363 Drusen (degenerative) of macula, bilateral: Secondary | ICD-10-CM | POA: Diagnosis not present

## 2024-04-04 DIAGNOSIS — H04123 Dry eye syndrome of bilateral lacrimal glands: Secondary | ICD-10-CM | POA: Diagnosis not present

## 2024-04-04 DIAGNOSIS — H26491 Other secondary cataract, right eye: Secondary | ICD-10-CM | POA: Diagnosis not present

## 2024-04-04 DIAGNOSIS — H43811 Vitreous degeneration, right eye: Secondary | ICD-10-CM | POA: Diagnosis not present

## 2024-04-04 DIAGNOSIS — Z961 Presence of intraocular lens: Secondary | ICD-10-CM | POA: Diagnosis not present

## 2024-04-04 DIAGNOSIS — H35371 Puckering of macula, right eye: Secondary | ICD-10-CM | POA: Diagnosis not present

## 2024-04-11 DIAGNOSIS — H26492 Other secondary cataract, left eye: Secondary | ICD-10-CM | POA: Diagnosis not present

## 2024-04-20 ENCOUNTER — Encounter

## 2024-05-31 ENCOUNTER — Ambulatory Visit
Admission: RE | Admit: 2024-05-31 | Discharge: 2024-05-31 | Disposition: A | Discharge status: Home / Self Care | Payer: Medicare (Managed Care) | Source: Ambulatory Visit | Attending: Family Medicine | Admitting: Family Medicine

## 2024-05-31 ENCOUNTER — Ambulatory Visit: Payer: Self-pay | Admitting: Family Medicine

## 2024-05-31 DIAGNOSIS — Z1231 Encounter for screening mammogram for malignant neoplasm of breast: Secondary | ICD-10-CM

## 2024-05-31 DIAGNOSIS — E2839 Other primary ovarian failure: Secondary | ICD-10-CM | POA: Diagnosis not present

## 2024-05-31 DIAGNOSIS — Z780 Asymptomatic menopausal state: Secondary | ICD-10-CM | POA: Diagnosis not present

## 2024-05-31 DIAGNOSIS — M8589 Other specified disorders of bone density and structure, multiple sites: Secondary | ICD-10-CM | POA: Diagnosis not present
